# Patient Record
Sex: Male | Born: 1975 | Race: Black or African American | Hispanic: No | Marital: Single | Smoking: Current every day smoker
Health system: Southern US, Community
[De-identification: ages and names within clinical notes are randomized; demographics above are authoritative.]

## PROBLEM LIST (undated history)

## (undated) DIAGNOSIS — I10 Essential (primary) hypertension: Secondary | ICD-10-CM

## (undated) DIAGNOSIS — E119 Type 2 diabetes mellitus without complications: Secondary | ICD-10-CM

---

## 2000-01-17 ENCOUNTER — Emergency Department (HOSPITAL_COMMUNITY): Admission: EM | Admit: 2000-01-17 | Discharge: 2000-01-17 | Payer: Self-pay | Admitting: Emergency Medicine

## 2000-08-12 ENCOUNTER — Emergency Department (HOSPITAL_COMMUNITY): Admission: EM | Admit: 2000-08-12 | Discharge: 2000-08-12 | Payer: Self-pay | Admitting: Unknown Physician Specialty

## 2000-08-23 ENCOUNTER — Emergency Department (HOSPITAL_COMMUNITY): Admission: EM | Admit: 2000-08-23 | Discharge: 2000-08-23 | Payer: Self-pay | Admitting: Emergency Medicine

## 2002-11-15 ENCOUNTER — Emergency Department (HOSPITAL_COMMUNITY): Admission: EM | Admit: 2002-11-15 | Discharge: 2002-11-15 | Payer: Self-pay | Admitting: Unknown Physician Specialty

## 2004-04-09 ENCOUNTER — Emergency Department: Payer: Self-pay | Admitting: Internal Medicine

## 2007-07-03 ENCOUNTER — Emergency Department: Payer: Self-pay | Admitting: Internal Medicine

## 2011-05-24 ENCOUNTER — Emergency Department: Payer: Self-pay | Admitting: Emergency Medicine

## 2012-03-21 ENCOUNTER — Emergency Department: Payer: Self-pay | Admitting: Internal Medicine

## 2014-02-03 ENCOUNTER — Emergency Department: Payer: Self-pay | Admitting: Emergency Medicine

## 2014-02-03 LAB — BASIC METABOLIC PANEL
ANION GAP: 12 (ref 7–16)
BUN: 13 mg/dL (ref 7–18)
Calcium, Total: 8.1 mg/dL — ABNORMAL LOW (ref 8.5–10.1)
Chloride: 109 mmol/L — ABNORMAL HIGH (ref 98–107)
Co2: 20 mmol/L — ABNORMAL LOW (ref 21–32)
Creatinine: 1.49 mg/dL — ABNORMAL HIGH (ref 0.60–1.30)
EGFR (Non-African Amer.): 57 — ABNORMAL LOW
Glucose: 109 mg/dL — ABNORMAL HIGH (ref 65–99)
Osmolality: 282 (ref 275–301)
Potassium: 3.6 mmol/L (ref 3.5–5.1)
SODIUM: 141 mmol/L (ref 136–145)

## 2014-02-03 LAB — CBC
HCT: 43.4 % (ref 40.0–52.0)
HGB: 14.2 g/dL (ref 13.0–18.0)
MCH: 31.9 pg (ref 26.0–34.0)
MCHC: 32.8 g/dL (ref 32.0–36.0)
MCV: 97 fL (ref 80–100)
PLATELETS: 197 10*3/uL (ref 150–440)
RBC: 4.46 10*6/uL (ref 4.40–5.90)
RDW: 13.8 % (ref 11.5–14.5)
WBC: 7.7 10*3/uL (ref 3.8–10.6)

## 2014-02-27 ENCOUNTER — Inpatient Hospital Stay
Admission: RE | Admit: 2014-02-27 | Discharge: 2014-05-10 | Disposition: A | Discharge status: Home / Self Care | Payer: PRIVATE HEALTH INSURANCE | Source: Ambulatory Visit | Attending: Internal Medicine | Admitting: Internal Medicine

## 2014-02-27 DIAGNOSIS — T148XXA Other injury of unspecified body region, initial encounter: Secondary | ICD-10-CM

## 2014-02-27 DIAGNOSIS — R52 Pain, unspecified: Secondary | ICD-10-CM

## 2014-02-27 DIAGNOSIS — L24A9 Irritant contact dermatitis due friction or contact with other specified body fluids: Secondary | ICD-10-CM

## 2014-02-27 DIAGNOSIS — L0291 Cutaneous abscess, unspecified: Principal | ICD-10-CM

## 2014-02-28 ENCOUNTER — Other Ambulatory Visit: Payer: Self-pay | Admitting: *Deleted

## 2014-03-01 ENCOUNTER — Non-Acute Institutional Stay (INDEPENDENT_AMBULATORY_CARE_PROVIDER_SITE_OTHER): Payer: PRIVATE HEALTH INSURANCE | Admitting: Internal Medicine

## 2014-03-01 DIAGNOSIS — S83105D Unspecified dislocation of left knee, subsequent encounter: Secondary | ICD-10-CM | POA: Diagnosis not present

## 2014-03-01 DIAGNOSIS — G609 Hereditary and idiopathic neuropathy, unspecified: Secondary | ICD-10-CM | POA: Diagnosis not present

## 2014-03-01 DIAGNOSIS — D62 Acute posthemorrhagic anemia: Secondary | ICD-10-CM | POA: Diagnosis not present

## 2014-03-01 DIAGNOSIS — A4902 Methicillin resistant Staphylococcus aureus infection, unspecified site: Secondary | ICD-10-CM

## 2014-03-01 NOTE — Progress Notes (Signed)
Patient ID: [name], male   DOB: 1975-04-06, 38 y.o.   MRN: [id number]   This is an acute visit.  Level care skilled.  Facility [facility].  Chief complaint acute visit status post hospitalization for left knee dislocation with popliteal injury.  History of present illness.  Patient was hospitalized emergently for a left popliteal bypass medial and lateral fasciectomy's-this was after sustaining a left knee dislocation.  Postop he had persistent tachycardia-this resolved apparently with pain control.  Drug screen was positive for cocaine which may be contributing to the tachycardia which was asymptomatic.  Patient was intermittently febrile-he was discovered to have a right groin hematoma which was evacuated.  His white count trended out study showed gas in the soft tissue in the posterior knee and soft tissue edema.  He was taken back to the OR for washout and debridement with identification of devitalized muscle anterior compartment including tibias anterior and exterior digitorum longus which was debridement.   wound cultures grew out MRSA-he was started on vancomycin and Zosyn.  Initially wound VAC was placed-.  He was seen for pain management with recommendation to begin long-acting OxyContin Cymbalta and Celebrex.  He was recommended for extensive post op therapy which is why he is here.  He has completed his antibiotic therapy.  Currently he has no acute complaints other than his pain management has occasional breakthrough pain he is receiving OxyIR 10 mg every 4 hours he says it does help some but it does not last for 4 hours.  Previous medical history.  History of left knee dislocation with complications as noted above.  MRSA infection of soft tissue.  Acute blood loss anemia.  History of cocaine abuse.  Social history-patient is single he does have a girlfriend and several children -apparently they are quite supportive.  He is from [place]-again  he does have a history of illicit drug use.  He is currently a smoker 1 pack per day.  And does have alcohol use apparently 6 shots of liquor per week.  .  Family history-apparently he has no pertinent family history.  Medications.  Aspirin 81 mg daily.  Celebrex 200 mg twice a day.  Vitamin B12 500 g daily.  Colace 100 mg twice a day.  Cymbalta 60 mg daily.  Ferrous sulfate 325 mg daily.  Neurontin 600 mg twice a day.  OxyContin controlled release 30 mg twice a day.  Oxycodone 10 mg immediate release every 4 hours as needed.  Protonix 40 mg daily  Review of systems.  General no complaints of fever or chills.  Skin does not complain of rashes or itching has numerous surgical sites apparently these are stable has a wound VAC applied to right groin area.  Eyes does not complain of visual changes.  Ear nose mouth and throat-no complaint of sore throat or hearing loss.  Her sclerae does not complain of shortness breath or cough.  Cardiac-is not complaining of chest pain had a history of tachycardia in the hospital thought multi factor oh.  GU does not complain of dysuria.  GI-does not complaining of nausea vomiting diarrhea constipation or abdominal pain apparently some history of constipation previously he is on Colace.  Musculoskeletal-again does have some leg pain here he says the controlled OxyContin as well as immediate release to help but the immediate release does not last quite long enough.  Neurologic does have a history of some numbness left leg status post numerous procedures he is on Neurontin-does not complain of  headache or dizziness.  Psychological does not complain of depression and anxiety.  Physical exam.  Temperature is 98.2 pulse 92 respirations 20 blood pressure 153/85.  General this is a pleasant young male in no distress lying comfortably in bed.  His skin is warm and dry-surgical site Staples are in place the right inner thigh back in  place I do not see drainage bleeding erythema or sign of infection.  He also has been itching of his left toe apparently there's a skin tear this has been assessed by nursing.  His left leg is wrapped -- he does have edema he says this is baseline since surgery-he does have a positive pulsepedal  \ eyes-pupils appear reactive to light visual acuity appears intact.  Oropharynx clear mucous membranes moist.  Chest is clear to auscultation there is no labored breathing.  Abdomen soft nontender with positive bowel sounds.  Muscle school skeletal-as noted above the he does move his right lower leg and arms with baseline strength and range of motion I do not note any deformities left leg has changes as noted above.  Neurologic-is grossly intact has reduced touch sensation of his left leg he says this is not new-his speech is clear cranial nerves intact  Labs.  02/24/2014.  WBC 10.9 hemoglobin 7.3 platelets 592.  Sodium 140 potassium 4.1 BUN 16 creatinine 0.83.  Assessment and plan.  #1 left knee dislocation with complications as noted above including popliteal bypass lateral fasciectomy-complicated with soft tissue MRSA infection-at this point appears stable he is afebrile-he does have a wound VAC applied to the right groin area--  He has completed antibiotics and is here for rehabilitation-.  In regards to pain control he says it is helping but OxyIR does not last long enough [facility] needed and monitor-there's been no sign of sedation or respiratory depression--  He continues on OxyContin controlled release 30 mg twice a day-Cymbalta-as well as Celebrex-this was recommended per hospital consult.  We'll need extensive wound care being provided by nursing with follow-up as needed by surgery.  #2-history of postop anemia-will update a CBC he is on iron-he appears to be relatively stable in this regards.  #3-history of neuropathy he is on Neurontin  this persists in the left leg he says this is not new this will have to be monitored.  #4-HTN-at this point will monitor recent systolics have been in the 150s area diastolics in the 80s-90s-would like to get some more readings since we have relatively few if consistently elevated consider  Update BMP  [id number]-of note greater than 40 minutes spent assessing patient-reviewing his hospital records-and coordinating and formulating a plan of care for numerous diagnoses-of note greater than 50% of time spent coordinating plan of care  ..Marland Kitchen

## 2014-03-03 ENCOUNTER — Encounter: Payer: Self-pay | Admitting: Internal Medicine

## 2014-03-03 DIAGNOSIS — A4902 Methicillin resistant Staphylococcus aureus infection, unspecified site: Secondary | ICD-10-CM | POA: Insufficient documentation

## 2014-03-03 DIAGNOSIS — D62 Acute posthemorrhagic anemia: Secondary | ICD-10-CM | POA: Insufficient documentation

## 2014-03-03 DIAGNOSIS — G609 Hereditary and idiopathic neuropathy, unspecified: Secondary | ICD-10-CM | POA: Insufficient documentation

## 2014-03-03 DIAGNOSIS — S83105A Unspecified dislocation of left knee, initial encounter: Secondary | ICD-10-CM | POA: Insufficient documentation

## 2014-03-04 ENCOUNTER — Non-Acute Institutional Stay (SKILLED_NURSING_FACILITY): Payer: PRIVATE HEALTH INSURANCE | Admitting: Internal Medicine

## 2014-03-04 ENCOUNTER — Other Ambulatory Visit: Payer: Self-pay | Admitting: *Deleted

## 2014-03-04 DIAGNOSIS — A4902 Methicillin resistant Staphylococcus aureus infection, unspecified site: Secondary | ICD-10-CM

## 2014-03-04 DIAGNOSIS — S81809D Unspecified open wound, unspecified lower leg, subsequent encounter: Secondary | ICD-10-CM

## 2014-03-04 DIAGNOSIS — S85002D Unspecified injury of popliteal artery, left leg, subsequent encounter: Secondary | ICD-10-CM | POA: Diagnosis not present

## 2014-03-04 MED ORDER — OXYCODONE HCL 10 MG PO TABS: ORAL_TABLET | ORAL | Status: DC

## 2014-03-04 NOTE — Telephone Encounter (Signed)
[facility] 

## 2014-03-05 NOTE — Progress Notes (Addendum)
Patient ID: [name], male   DOB: October 22, 1975, 38 y.o.   MRN: [id number]               HISTORY & PHYSICAL  DATE:  03/04/2014     FACILITY: [facility]    LEVEL OF CARE:   SNF   CHIEF COMPLAINT:  Admission to SNF, post stay at [facility], 02/03/2014 through 02/27/2014.    HISTORY OF PRESENT ILLNESS:  This is a man who tells me he was out playing football in his neighborhood.  He ran for the ball land stepped in a pothole.  He was apparently admitted to another hospital and later [facility] after suffering a left knee dislocation, resulting in loss of pulses distal to the knee with popliteal injury.  It is noted that he was without distal lower extremity pulses for some period of time prior to the transfer.    He was taken urgently to the OR for a left popliteal bypass, medial and lateral fasciotomies.  He was identified as having a popliteal injury, sciatic nerve and tibial nerve stretch injury.  There is a harvest site in the right leg with vein where the femoral popliteal bypass.  He has an external fixator.    His CK peaked at 30,000.    Urine drug screen was positive for cocaine, contributing to persistent asymptomatic tachycardia.    There were voiding difficulties.  However, he managed to have a Foley discontinued on hospital day #3.  He was taken back to the OR on 02/12/2014 for washout and debridement of the lower extremity with devitalized muscle at the anterior compartment, including tibia os anterior and extensor digitorum longus which were debrided.  Intraoperative wound cultures grew MRSA.  He was started on vancomycin and Zosyn, which was tapered to gram-positive coverage once the cultures became known.    He was seen by Plastics for a wound at the posterior left lower extremity, who recommended Silvadene treatment over the skin.    He was taken back to the OR on 02/15/2014 for lower extremity washout and posterior department debridement with  placement of a wound VAC to the medial and lateral fasciotomy sites.    Chronic Pain saw the patient for management of uncontrolled pain, on multiple narcotics.    PAST MEDICAL HISTORY/PROBLEM LIST:   The only thing the patient told me about was hypertension.             CURRENT MEDICATIONS:  Medication list is reviewed.    ASA 81 q.d.    Celebrex 200 b.i.d.     Vitamin B12 500 daily.    Colace 100 b.i.d.    Cymbalta 60 q.d.    Ferrous sulfate 325 daily.    Neurontin 300, 2 tablets/600 mg three times a day.    OxyContin 30 mg q.12.    Oxycodone 10 mg q.4 p.r.n.    Protonix 40 q.d.    Silvadene to the posterior left leg.    SOCIAL HISTORY:    HOUSING:  The patient lives in [place] with his girlfriend.   FUNCTIONAL STATUS:  Independent man.  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   TOBACCO USE:  He smokes.   ILLICIT DRUGS:  Apparent history of cocaine use.  I did not get into this with him today.    REVIEW OF SYSTEMS:   CHEST/RESPIRATORY:  No shortness of breath.  CARDIAC:   No chest pain.    GI:  Bowels moving  well.  No issues.   GU:  No dysuria.    PHYSICAL EXAMINATION:   GENERAL APPEARANCE:  The patient is not in any distress.   CHEST/RESPIRATORY:  Clear air entry bilaterally.   CARDIOVASCULAR:  CARDIAC:   Heart sounds are normal.   GASTROINTESTINAL:  LIVER/SPLEEN/KIDNEYS:  No liver, no spleen.  No tenderness.   MUSCULOSKELETAL:   EXTREMITIES:   LEFT LOWER EXTREMITY:  He has two large fasciotomy sites, one on the medial distal left leg and one on the lateral.  Both of these have very clean, healthy granulation tissue.  They are applying wet-to-dry dressings.  He has an area on the posterior left leg that has a necrotic eschar.  They are applying Silvadene to this.  The usefulness of this here does not seem to be apparent to me.  However, for now I will leave these orders intact.  The leg is wrapped with a Kerlix/Ace wrap.   RIGHT LOWER EXTREMITY:   To the original vein harvest site in the right leg, they have a wound VAC.  Again, the tissue here looks really very healthy.     NEUROLOGICAL:    SENSATION/STRENGTH:  He does not have  sensation from an area just about at the ankle region down into the foot on the left.  He does have some plantar flexion, but no dorsiflexion.  This is compatible with peripheral nerve injury.    Notable that the entire leg is immobilized with an external fixator.    ASSESSMENT/PLAN:           Traumatic dislocation of the left knee with interruption of the popliteal vein.  Status post bypass graft, femoral to popliteal.  He had three surgeries for compartment syndrome as described above.  He has anesthesia and weakness of the distal left foot and ankle. 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   MRSA infection.  He has completed antibiotics.  He is no longer on any antibiotics.    Wound in the vein harvest site on the right medial upper thigh.  This looks healthy.  Wound VAC being applied here.    History of hypertension.   He is on atenolol.    History of cocaine abuse.  I did not discuss this with him today.    Tobacco abuse.  He will not be able to smoke while he is here.  He is aware of this.     CPT CODE: [id number]

## 2014-03-07 ENCOUNTER — Other Ambulatory Visit: Payer: Self-pay | Admitting: *Deleted

## 2014-03-07 MED ORDER — OXYCODONE HCL 10 MG PO TABS: ORAL_TABLET | ORAL | Status: DC

## 2014-03-07 NOTE — Telephone Encounter (Signed)
[facility] 

## 2014-03-12 ENCOUNTER — Other Ambulatory Visit: Payer: Self-pay | Admitting: *Deleted

## 2014-03-12 MED ORDER — OXYCODONE HCL 10 MG PO TABS: ORAL_TABLET | ORAL | Status: DC

## 2014-03-12 NOTE — Telephone Encounter (Signed)
[facility]

## 2014-03-20 ENCOUNTER — Non-Acute Institutional Stay (SKILLED_NURSING_FACILITY): Payer: PRIVATE HEALTH INSURANCE | Admitting: Internal Medicine

## 2014-03-20 DIAGNOSIS — R1013 Epigastric pain: Secondary | ICD-10-CM

## 2014-03-20 DIAGNOSIS — G8929 Other chronic pain: Secondary | ICD-10-CM | POA: Diagnosis not present

## 2014-03-20 DIAGNOSIS — I10 Essential (primary) hypertension: Secondary | ICD-10-CM

## 2014-03-25 NOTE — Progress Notes (Addendum)
Patient ID: [name], male   DOB: [birth year], 38 y.o.   MRN: [id number]               PROGRESS NOTE  DATE:  03/20/2014      FACILITY: [facility]    LEVEL OF CARE:   SNF   Acute Visit   HISTORY OF PRESENT ILLNESS:   This is a man who came to us after a stay at [facility] from 02/23/2014 through 02/27/2014.  He originally injured his left leg, suffering a left knee dislocation while playing outside and stepping in a pothole.  He ended up with popliteal artery injury.  He required urgent trip to the OR for popliteal artery bypass, medial and lateral fasciectomy secondary to compartment syndrome.  He also has sciatic nerve and tibial stretch injury.    Premorbidly, the only thing the patient had was borderline hypertension.  I have been shown his blood pressures today which are generally in the 150/80, 130/90 range.  She is on atenolol, which was started in the hospital.    He had a recent trip back to see his surgeons, who removed his external fixator.  His leg is wrapped and he is in a brace.    REVIEW OF SYSTEMS:   NEUROLOGICAL:  The patient is unable to move or feel his foot.    CARDIAC:   He has no cardiac history and was told by his employment nurse that he has borderline hypertension.    GI:  He is complaining of upper abdominal pain just below his rib cage.  This is episodic.  He states he is having bowel movements.  No nausea or vomiting.   CHEST/RESPIRATORY:   He is not short of breath.  This is not pleuritic.    PHYSICAL EXAMINATION:   GENERAL APPEARANCE:  The patient is not in any distress.   CHEST/RESPIRATORY:  Clear air entry bilaterally.   CARDIOVASCULAR:  CARDIAC:   Heart sounds are normal.  There are no murmurs.    GASTROINTESTINAL:  ABDOMEN:   His abdomen is distended.  Bowel sounds, however, are positive in rushes.   MUSCULOSKELETAL:   CHEST WALL:  I cannot reproduce his pain by palpating his sternum or his ribs.       EXTREMITIES:   LEFT  LOWER EXTREMITY:  I did not remove his brace or his wrap.  He still does not have any function or sensation in the left foot.    ASSESSMENT/PLAN:                     Borderline hypertension.  I have in my original notes that he was on atenolol, although I do not see this currently.    Upper abdominal/chest wall pain.  I wonder whether this is simply distention from colonic or intestinal gas.  I am going to put him on routine MiraLAX to see if we can mobilize this.  At this point, this does not sound ominous.  It is certainly not exertional.

## 2014-03-27 ENCOUNTER — Ambulatory Visit (HOSPITAL_COMMUNITY): Payer: PRIVATE HEALTH INSURANCE

## 2014-03-27 ENCOUNTER — Non-Acute Institutional Stay (SKILLED_NURSING_FACILITY): Payer: PRIVATE HEALTH INSURANCE | Admitting: Internal Medicine

## 2014-03-27 ENCOUNTER — Ambulatory Visit (HOSPITAL_COMMUNITY)
Admit: 2014-03-27 | Discharge: 2014-03-27 | Disposition: A | Discharge status: Home / Self Care | Payer: PRIVATE HEALTH INSURANCE | Source: Ambulatory Visit | Attending: Internal Medicine | Admitting: Internal Medicine

## 2014-03-27 DIAGNOSIS — T814XXA Infection following a procedure, initial encounter: Secondary | ICD-10-CM | POA: Insufficient documentation

## 2014-03-27 DIAGNOSIS — B999 Unspecified infectious disease: Secondary | ICD-10-CM | POA: Insufficient documentation

## 2014-03-27 DIAGNOSIS — A4902 Methicillin resistant Staphylococcus aureus infection, unspecified site: Secondary | ICD-10-CM

## 2014-03-27 DIAGNOSIS — L02419 Cutaneous abscess of limb, unspecified: Secondary | ICD-10-CM | POA: Diagnosis not present

## 2014-03-27 DIAGNOSIS — L089 Local infection of the skin and subcutaneous tissue, unspecified: Secondary | ICD-10-CM | POA: Diagnosis not present

## 2014-03-27 DIAGNOSIS — L03119 Cellulitis of unspecified part of limb: Secondary | ICD-10-CM | POA: Diagnosis not present

## 2014-03-27 DIAGNOSIS — Z452 Encounter for adjustment and management of vascular access device: Secondary | ICD-10-CM | POA: Insufficient documentation

## 2014-03-27 NOTE — Progress Notes (Signed)
Peripherally Inserted Central Catheter/Midline Placement  The IV Nurse has discussed with the patient and/or persons authorized to consent for the patient, the purpose of this procedure and the potential benefits and risks involved with this procedure.  The benefits include less needle sticks, lab draws from the catheter and patient may be discharged home with the catheter.  Risks include, but not limited to, infection, bleeding, blood clot (thrombus formation), and puncture of an artery; nerve damage and irregular heat beat.  Alternatives to this procedure were also discussed.  PICC/Midline Placement Documentation  PICC / Midline Single Lumen 03/27/14 PICC Left Basilic 49 cm 1 cm (Active)  Indication for Insertion or Continuance of Line Prolonged intravenous therapies 03/27/2014  2:44 PM  Exposed Catheter (cm) 1 cm 03/27/2014  2:44 PM  Site Assessment Clean;Dry;Intact 03/27/2014  2:44 PM  Line Status Flushed;Saline locked;Blood return noted 03/27/2014  2:44 PM  Dressing Type Transparent;Securing device 03/27/2014  2:44 PM  Dressing Status Clean;Dry;Intact;Antimicrobial disc in place 03/27/2014  2:44 PM  Line Care Connections checked and tightened 03/27/2014  2:44 PM  Dressing Intervention New dressing 03/27/2014  2:44 PM  Dressing Change Due 04/03/14 03/27/2014  2:44 PM       Inserted by Trenton GammonGina Witherspoon, PICC RN  03/27/2014, 2:48 PM

## 2014-03-27 NOTE — Progress Notes (Signed)
Patient ID: [name], male   DOB: 12/23/1975, 38 y.o.   MRN: [id number] Facility; [facility] Chief complaint; draining sinuses on the left leg History; this is a patient it [facility]. He is a gentleman who had a left knee dislocation while playing football near his home and an injury of the left popliteal artery which was repaired. He had sciatic nerve and tibial nerve stretch injury. His hospital stay was complex and he required bilateral fasciotomies of the lower left leg secondary I believe to compartment syndrome. His postoperative course from this was complicated by a CT scan that showed gas in the soft tissues in the posterior knee and soft tissue edema. He was taken back to the OR for washout and debridement of left lower extremity. Intraoperative cultures grew MRSA. He was treated with thank and Zosyn ultimately changed to Zosyn which he completed a 2 week course which ended on 02/27/2014. He has not been on antibiotics while he is in this facility.   2 days ago the staff discovered a draining area on the anterior part of his left leg between the fasciotomy incisions over the tibia. This was deeply cultured by the wound care nurse. A subsequent draining area was noted on the inferior aspect of the medial wound on the left leg medial incision. This also probes to bone. The patient notes increasing pain. Apparently the drainage was purulent and malodorous. He has not been running a fever. I actually saw him 2 days ago for an unrelated issue in the morning and this was not present, both the wound care nurse and the patient reiterate this  Physical examination Gen. the patient is not in any distress Vitals blood pressure 153/82 respirations 20 pulse 83 temperature 98.2 Left anterior leg; his fasciotomy incisions have clean granulation and these are gradually getting better. He has some areas on the posterior aspect of the leg that are covered with a blackened eschar which  apparently is going to be debridement by plastic surgery. These do not look to be the source of any infection. On the anterior aspect of the left leg right over the tibia and in the inferior aspect of the medial left fasciotomy wound are 2 draining sinuses. These probe close to her on the tibia itself. As mentioned a culture has been done of this area. There appears to be some swelling and some tenderness although the patient has nerve damage here and sensation is limited.  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 A culture that we did the about 18 hours ago so far is negative although it shows a lot of white cells. Patient is going to need imaging studies here probably a CT scan with contrast. I suspect he is going to need intravenous antibiotics and I'm going to restart the vancomycin and Zosyn.   I will attempt to arrange a return to [facility]. A repeat culture of the lateral draining area will be done

## 2014-03-27 NOTE — Discharge Instructions (Signed)
Peripherally Inserted Central Catheter/Midline Placement  The IV Nurse has discussed with the patient and/or persons authorized to consent for the patient, the purpose of this procedure and the potential benefits and risks involved with this procedure.  The benefits include less needle sticks, lab draws from the catheter and patient may be discharged home with the catheter.  Risks include, but not limited to, infection, bleeding, blood clot (thrombus formation), and puncture of an artery; nerve damage and irregular heat beat.  Alternatives to this procedure were also discussed.  PICC/Midline Placement Documentation  PICC / Midline Single Lumen 03/27/14 PICC Left Basilic 49 cm 1 cm (Active)  Indication for Insertion or Continuance of Line Prolonged intravenous therapies 03/27/2014  2:44 PM  Exposed Catheter (cm) 1 cm 03/27/2014  2:44 PM  Site Assessment Clean;Dry;Intact 03/27/2014  2:44 PM  Line Status Flushed;Saline locked;Blood return noted 03/27/2014  2:44 PM  Dressing Type Transparent;Securing device 03/27/2014  2:44 PM  Dressing Status Clean;Dry;Intact;Antimicrobial disc in place 03/27/2014  2:44 PM  Line Care Connections checked and tightened 03/27/2014  2:44 PM  Dressing Intervention New dressing 03/27/2014  2:44 PM  Dressing Change Due 04/03/14 03/27/2014  2:44 PM    PICC Insertion, Care After Refer to this sheet in the next few weeks. These instructions provide you with information on caring for yourself after your procedure. Your health care provider may also give you more specific instructions. Your treatment has been planned according to current medical practices, but problems sometimes occur. Call your health care provider if you have any problems or questions after your procedure. WHAT TO EXPECT AFTER THE PROCEDURE After your procedure, it is typical to have the following:  Mild discomfort at the insertion site. This should not last more than a day. HOME CARE INSTRUCTIONS  Rest  at home for the remainder of the day after the procedure.  You may bend your arm and move it freely. If your PICC is near or at the bend of your elbow, avoid activity with repeated motion at the elbow.  Avoid lifting heavy objects as instructed by your health care provider.  Avoid using a crutch with the arm on the same side as your PICC. You may need to use a walker. Bandage Care  Keep your PICC bandage (dressing) clean and dry to prevent infection.  Ask your health care provider when you may shower. To keep the dressing dry, cover the PICC with plastic wrap and tape before showering. If the dressing does become wet, replace it right after the shower.  Do not soak in the bath, swim, or use hot tubs when you have a PICC.  Change the PICC dressing as instructed by your health care provider.  Change your PICC dressing if it becomes loose or wet. General PICC Care  Check the PICC insertion site daily for leakage, redness, swelling, or pain.  Flush the PICC as directed by your health care provider. Let your health care provider know right away if the PICC is difficult to flush or does not flush. Do not use force to flush the PICC.  Do not use a syringe that is less than 10 mL to flush the PICC.  Never pull or tug on the PICC.  Avoid blood pressure checks on the arm with the PICC.  Keep your PICC identification card with you at all times.  Do not take the PICC out yourself. Only a trained health care professional should remove the PICC. SEEK MEDICAL CARE IF:  You have pain  in your arm, ear, face, or teeth.  You have fever or chills.  You have drainage from the PICC insertion site.  You have redness or palpate a "cord" around the PICC insertion site.  You cannot flush the catheter. SEEK IMMEDIATE MEDICAL CARE IF:  You have swelling in the arm in which the PICC is inserted. Document Released: 01/03/2013 Document Revised: 03/20/2013 Document Reviewed: 01/03/2013 [facility]  Patient Information 2015 [employer] [place]. This information is not intended to replace advice given to you by your health care provider. Make sure you discuss any questions you have with your health care provider.   If you have any questions concerning your PICC line please call [facility] [phone] or [phone].

## 2014-03-28 ENCOUNTER — Ambulatory Visit (HOSPITAL_COMMUNITY)
Admit: 2014-03-28 | Discharge: 2014-03-28 | Disposition: A | Discharge status: Home / Self Care | Payer: PRIVATE HEALTH INSURANCE | Source: Ambulatory Visit | Attending: Internal Medicine | Admitting: Internal Medicine

## 2014-03-28 ENCOUNTER — Other Ambulatory Visit (HOSPITAL_COMMUNITY): Payer: PRIVATE HEALTH INSURANCE

## 2014-03-28 ENCOUNTER — Ambulatory Visit (HOSPITAL_COMMUNITY)
Admit: 2014-03-28 | Discharge: 2014-03-28 | Disposition: A | Discharge status: Home / Self Care | Payer: PRIVATE HEALTH INSURANCE | Attending: Internal Medicine | Admitting: Internal Medicine

## 2014-03-28 DIAGNOSIS — L02416 Cutaneous abscess of left lower limb: Secondary | ICD-10-CM | POA: Diagnosis not present

## 2014-03-28 DIAGNOSIS — M7989 Other specified soft tissue disorders: Secondary | ICD-10-CM | POA: Insufficient documentation

## 2014-03-28 DIAGNOSIS — M25562 Pain in left knee: Secondary | ICD-10-CM | POA: Insufficient documentation

## 2014-03-28 MED ORDER — IOHEXOL 300 MG/ML  SOLN: 100.0000 mL | Freq: Once | INTRAMUSCULAR | Status: AC | PRN

## 2014-03-28 MED ORDER — GADOBENATE DIMEGLUMINE 529 MG/ML IV SOLN
20.0000 mL | Freq: Once | INTRAVENOUS | Status: AC | PRN
  Administered 2014-03-28: 20 mL via INTRAVENOUS

## 2014-04-05 ENCOUNTER — Other Ambulatory Visit: Payer: Self-pay | Admitting: *Deleted

## 2014-04-05 MED ORDER — OXYCODONE HCL 10 MG PO TABS: ORAL_TABLET | ORAL | Status: DC

## 2014-04-05 MED ORDER — OXYCODONE HCL ER 30 MG PO T12A: EXTENDED_RELEASE_TABLET | ORAL | Status: DC

## 2014-04-05 NOTE — Telephone Encounter (Signed)
[facility] 

## 2014-04-08 ENCOUNTER — Non-Acute Institutional Stay (SKILLED_NURSING_FACILITY): Payer: PRIVATE HEALTH INSURANCE | Admitting: Internal Medicine

## 2014-04-08 DIAGNOSIS — L02419 Cutaneous abscess of limb, unspecified: Secondary | ICD-10-CM | POA: Diagnosis not present

## 2014-04-08 DIAGNOSIS — S85002D Unspecified injury of popliteal artery, left leg, subsequent encounter: Secondary | ICD-10-CM | POA: Diagnosis not present

## 2014-04-08 DIAGNOSIS — A4902 Methicillin resistant Staphylococcus aureus infection, unspecified site: Secondary | ICD-10-CM | POA: Diagnosis not present

## 2014-04-08 DIAGNOSIS — L03119 Cellulitis of unspecified part of limb: Secondary | ICD-10-CM

## 2014-04-09 NOTE — Progress Notes (Addendum)
Patient ID: [name], male   DOB: 07-20-1975, 39 y.o.   MRN: [id number]               HISTORY & PHYSICAL  DATE:  04/08/2014                 FACILITY: [facility]             LEVEL OF CARE:   SNF   CHIEF COMPLAINT:  Readmission to the facility, post stay at [facility], 03/28/2014 through 04/04/2014.    HISTORY OF PRESENT ILLNESS:  This is a patient who initially came to us from [facility].  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  He required an external fixator, a fem-pop bypass with vein at the time.    Postoperative course was complicated by compartment syndrome, wound infection, requiring fasciotomies and multiple returns to the OR for wash-outs.  His postoperative wound infection was polymicrobial with anaerobes, diphtheroids, and methicillin-sensitive Staph aureus.  The patient was discharged on vancomycin for two weeks.    Before he went out, it was noted here that he had a draining sinus on the anterior right leg and also on the lateral aspect of one of his fasciotomy incisions.  I believe these ultimately cultured MRSA.    [facility] to [facility].  He had an incision and drainage of an abscess in the OR on 03/30/2014.  Restarted on IV antibiotics.  Intraoperative cultures grew Staph aureus.  However, due to his previous polymicrobial infections, he is now discharged on six weeks of Invanz and vancomycin.  He is recommended for 30% weightbearing status with an appropriate brace and boot.    CURRENT MEDICATIONS:  Discharge medications include:    ASA 81 q.d.    Vitamin B12, 500 daily.    Colace 100 b.i.d.    Cymbalta 60 mg daily.    Ertapenem 1 g IV q.d.  Stop date is 05/10/2014.    Ferrous sulfate 325, 1 tablet daily with breakfast.    Neurontin 300 mg, 2 capsules three times a day.    Hydrochlorothiazide 50 mg a day for hypertension (I believe this was started in the hospital).    Oxycodone 30 mg  q.12.     Oxycodone IR 10 mg q.4 p.r.n.    Vancomycin 1.5 g every 8 hours.  Stop date is 05/10/2014.             PHYSICAL EXAMINATION:    GENERAL APPEARANCE:  The patient looks well.   CHEST/RESPIRATORY:  Exam is clear.   CARDIOVASCULAR:  CARDIAC:   Heart sounds are normal.  There are no murmurs.   SKIN:  INSPECTION:  Left leg:  Her lateral left leg incision actually appears to be well granulated.  It appears that they did his surgery through the inferior aspect of this wound and this drains down roughly 3 cm.   The area on the right lateral leg has healthy-looking granulation and should heal.  He has a surface eschar over a posterior leg wound that was there previously.  This is beginning to separate.  I am surprised that they did not do anything about this in the hospital.  However, I will debride this further on Wednesday.   NEUROLOGICAL:   He has had no return in the left foot, no sensory or motor function in the ankle or below.     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  He has undergone wound debridement and incision and drainage.  He is now on broad spectrum antibiotics, including vancomycin and ertapenem.  He is to have a follow-up imaging study on 04/16/2014, an MRI.  He has follow-up with Orthopedics on 04/25/2014 and with Infectious Disease on 05/07/2014.  He is approved to begin weightbearing.

## 2014-04-10 ENCOUNTER — Non-Acute Institutional Stay (SKILLED_NURSING_FACILITY): Payer: PRIVATE HEALTH INSURANCE | Admitting: Internal Medicine

## 2014-04-10 DIAGNOSIS — R21 Rash and other nonspecific skin eruption: Secondary | ICD-10-CM

## 2014-04-10 DIAGNOSIS — A4902 Methicillin resistant Staphylococcus aureus infection, unspecified site: Secondary | ICD-10-CM

## 2014-04-10 DIAGNOSIS — S83105D Unspecified dislocation of left knee, subsequent encounter: Secondary | ICD-10-CM | POA: Diagnosis not present

## 2014-04-14 NOTE — Progress Notes (Signed)
Patient ID: [name], male   DOB: 01-01-1976, 39 y.o.   MRN: [id number]  :                    FACILITY: [facility]             LEVEL OF CARE:   SNF   CHIEF COMPLAINT: Acute visit secondary to lump under her left knee    HISTORY OF PRESENT ILLNESS:  This is a patient who initially came to [place] from [facility].  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  He required an external fixator, a fem-pop bypass with vein at the time.    Postoperative course was complicated by compartment syndrome, wound infection, requiring fasciotomies and multiple returns to the OR for wash-outs.  His postoperative wound infection was polymicrobial with anaerobes, diphtheroids, and methicillin-sensitive Staph aureus.  The patient was discharged on vancomycin for two weeks.    Before he went out, it was noted here that he had a draining sinus on the anterior right leg and also on the lateral aspect of one of his fasciotomy incisions.  I believe these ultimately cultured MRSA.    He returned to [facility].  He had an incision and drainage of an abscess in the OR on 03/30/2014.  Restarted on IV antibiotics.  Intraoperative cultures grew Staph aureus.  However, due to his previous polymicrobial infections, hewas discharged on six weeks of Invanz and vancomycin.  He is recommended for 30% weightbearing status with an appropriate brace and boot.   Patient has developed a lump on the posterior inferior portion of the left knee area this is slightly tender to palpation not overtly erythematous or significantly warm-he says this was present in the hospital to some extent possibly has gotten a bit larger-of note he did have a boot applied earlier today in this area appears to be somewhat superior to where the strap was which would make one think possibly this is more of a fluid situation.       CURRENT MEDICATIONS:  Discharge medications include:    ASA 81 q.d.     Vitamin B12, 500 daily.    Colace 100 b.i.d.    Cymbalta 60 mg daily.    Ertapenem 1 g IV q.d.  Stop date is 05/10/2014.    Ferrous sulfate 325, 1 tablet daily with breakfast.    Neurontin 300 mg, 2 capsules three times a day.    Hydrochlorothiazide 50 mg a day for hypertension (I believe this was started in the hospital).    Oxycodone 30 mg q.12.     Oxycodone IR 10 mg q.4 p.r.n.    Vancomycin 1.5 g every 8 hours.  Stop date is 05/10/2014.    Review of systems.    Does not complain of fever or chills.    Respiratory no shortness of breath or cough.    Cardiac no chest pain.    Musculoskeletal does not complain of any unrelieved leg or joint pain currently             PHYSICAL EXAMINATION:     He is afebrile GENERAL APPEARANCE:  The patient looks well.   CHEST/RESPIRATORY:  Exam is clear.   CARDIOVASCULAR:  CARDIAC:   Heart sounds are normal.  There are no murmurs.   SKIN:  INSPECTION:  Left leg:  Her lateral left leg incision  appears to be well granulated.  .   The area  on the right lateral leg has healthy-looking granulation .--.  I do note inferior-posterior to the left knee there is a flesh-colored lump-this is not overtly warm to touch or overtly erythematous it is mildly tender to palpation- .   NEUROLOGICAL:   He has had no return in the left foot, no sensory or motor function in the ankle or below.    Labs.  04/05/2014.  WBC 9.1 hemoglobin 10.3 platelets 343.  Liver function tests within normal limits.  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  He has undergone wound debridement and incision and drainage.  He is now on broad spectrum antibiotics, including vancomycin and ertapenem.  He is to have a follow-up imaging study on 04/16/2014, an MRI.  He has follow-up  with Orthopedics on 04/25/2014 and with Infectious Disease on 05/07/2014.  He is approved to begin weightbearing.    In regards to lump under left knee-this will warrant close monitoring for now-again this could be fluid related but will have to be watched-speaking with the wound care nurse who noticed it she says the lump actually has gone down since the boot strap was removed which would lead one to believe this is possibly more of a fluid shift situation-however this will have to be watched closely    [id number]

## 2014-04-24 ENCOUNTER — Non-Acute Institutional Stay (SKILLED_NURSING_FACILITY): Payer: PRIVATE HEALTH INSURANCE | Admitting: Internal Medicine

## 2014-04-24 DIAGNOSIS — L02419 Cutaneous abscess of limb, unspecified: Secondary | ICD-10-CM | POA: Diagnosis not present

## 2014-04-24 DIAGNOSIS — L03119 Cellulitis of unspecified part of limb: Secondary | ICD-10-CM

## 2014-04-28 NOTE — Progress Notes (Addendum)
Patient ID: [name], male   DOB: 1975/09/11, 39 y.o.   MRN: [id number]                PROGRESS NOTE  DATE:  04/24/2014              FACILITY: [facility]                       LEVEL OF CARE:   SNF   Acute Visit                                  CHIEF COMPLAINT:  Follow up left leg.    HISTORY OF PRESENT ILLNESS:  The nurses in the facility have expressed some concern about swelling on the lateral aspect of the left leg and continued drainage.  In reviewing things on the chart and also discussing with the patient, he apparently had an MRI the week before last although we do not have these results.  He continues on vancomycin and Invanz for methicillin-sensitive Staph aureus as well as polymicrobial infection and possible anaerobic infection.  He also has an area covered with a thick, black eschar on the back of his leg.    [facility].  He finishes with these in February.  He has a January 28th follow-up with Ortho and a February 9th appointment with ID.    For some reason, he is felt to need a skin graft on the back of his left leg, although I am not really sure about whether this is going to be necessary.     PHYSICAL EXAMINATION:   NEUROLOGIC:   SENSATION/STRENGTH:  Left leg:  There has been perhaps a small amount of improvement in what he can do with the left foot, although that may be grasping at straws.  He is insensate on the anterior part of his leg to just below the tibial tuberosity.  He feels normally in the back of his leg.   SKIN:   INSPECTION:  Left leg:   The wounds are receding on the lateral aspect.  The draining area/surgical area on the inferior recess of the wound is still open, although there does not appear to be any purulent drainage.  Although there is still some swelling, I think this is gradually receding.    On the back of the left leg is his original wound that was covered with a black eschar.  I removed this.  Further  debridement is going to need to be done.  Whether this needs a skin graft or not ultimately, I am uncertain.             ASSESSMENT/PLAN:                              Infection of the left leg.  I think this actually appears stable.   He had an MRI two weeks ago.  We did not get the result but I think if there was not improvement, we would have heard.  He continues on his antibiotics until, I believe, the middle part of February.

## 2014-05-01 ENCOUNTER — Non-Acute Institutional Stay (SKILLED_NURSING_FACILITY): Payer: PRIVATE HEALTH INSURANCE | Admitting: Internal Medicine

## 2014-05-01 DIAGNOSIS — S81809D Unspecified open wound, unspecified lower leg, subsequent encounter: Secondary | ICD-10-CM | POA: Diagnosis not present

## 2014-05-01 DIAGNOSIS — L03119 Cellulitis of unspecified part of limb: Secondary | ICD-10-CM | POA: Diagnosis not present

## 2014-05-01 DIAGNOSIS — L02419 Cutaneous abscess of limb, unspecified: Secondary | ICD-10-CM

## 2014-05-01 DIAGNOSIS — A4902 Methicillin resistant Staphylococcus aureus infection, unspecified site: Secondary | ICD-10-CM

## 2014-05-02 ENCOUNTER — Encounter (HOSPITAL_COMMUNITY)
Admission: RE | Admit: 2014-05-02 | Discharge: 2014-05-02 | Disposition: A | Discharge status: Home / Self Care | Payer: PRIVATE HEALTH INSURANCE | Source: Skilled Nursing Facility | Attending: Internal Medicine | Admitting: Internal Medicine

## 2014-05-02 LAB — VANCOMYCIN, TROUGH: Vancomycin Tr: 16.2 ug/mL (ref 10.0–20.0)

## 2014-05-03 ENCOUNTER — Other Ambulatory Visit (HOSPITAL_COMMUNITY)
Admission: RE | Admit: 2014-05-03 | Discharge: 2014-05-03 | Disposition: A | Discharge status: Home / Self Care | Payer: PRIVATE HEALTH INSURANCE | Source: Ambulatory Visit | Attending: Internal Medicine | Admitting: Internal Medicine

## 2014-05-03 LAB — CBC WITH DIFFERENTIAL/PLATELET
BASOS PCT: 1 % (ref 0–1)
Basophils Absolute: 0.1 10*3/uL (ref 0.0–0.1)
EOS ABS: 0.3 10*3/uL (ref 0.0–0.7)
EOS PCT: 4 % (ref 0–5)
HEMATOCRIT: 38.6 % — AB (ref 39.0–52.0)
HEMOGLOBIN: 11.9 g/dL — AB (ref 13.0–17.0)
Lymphocytes Relative: 31 % (ref 12–46)
Lymphs Abs: 2.5 10*3/uL (ref 0.7–4.0)
MCH: 27 pg (ref 26.0–34.0)
MCHC: 30.8 g/dL (ref 30.0–36.0)
MCV: 87.7 fL (ref 78.0–100.0)
MONOS PCT: 12 % (ref 3–12)
Monocytes Absolute: 0.9 10*3/uL (ref 0.1–1.0)
Neutro Abs: 4.2 10*3/uL (ref 1.7–7.7)
Neutrophils Relative %: 52 % (ref 43–77)
PLATELETS: 292 10*3/uL (ref 150–400)
RBC: 4.4 MIL/uL (ref 4.22–5.81)
RDW: 15.7 % — AB (ref 11.5–15.5)
WBC: 8 10*3/uL (ref 4.0–10.5)

## 2014-05-03 LAB — BUN: BUN: 15 mg/dL (ref 6–23)

## 2014-05-03 LAB — HEPATIC FUNCTION PANEL
ALT: 80 U/L — AB (ref 0–53)
AST: 34 U/L (ref 0–37)
Albumin: 3.1 g/dL — ABNORMAL LOW (ref 3.5–5.2)
Alkaline Phosphatase: 131 U/L — ABNORMAL HIGH (ref 39–117)
BILIRUBIN DIRECT: 0.1 mg/dL (ref 0.0–0.5)
BILIRUBIN INDIRECT: 0.4 mg/dL (ref 0.3–0.9)
TOTAL PROTEIN: 6.7 g/dL (ref 6.0–8.3)
Total Bilirubin: 0.5 mg/dL (ref 0.3–1.2)

## 2014-05-03 LAB — CREATININE, SERUM
CREATININE: 0.87 mg/dL (ref 0.50–1.35)
GFR calc Af Amer: >90 mL/min (ref >90)

## 2014-05-04 NOTE — Progress Notes (Addendum)
Patient ID: [name], male   DOB: Aug 26, 1975, 39 y.o.   MRN: [id number]               PROGRESS NOTE  DATE:  05/01/2014                 FACILITY: [facility]                         LEVEL OF CARE:   SNF   Acute Visit   CHIEF COMPLAINT:  Follow up left leg.    HISTORY OF PRESENT ILLNESS:  I am following up this man's surgical wounds on the left leg, which generally have been progressing quite well.  He also [facility].    He continues on his antibiotics, vanc and Invanz, for the methicillin-sensitive Staph aureus as well as polymicrobial, possibly anaerobic infections.    He has an area on the back of his left leg that was initially covered with a black eschar and I have been following this.    PHYSICAL EXAMINATION:                   SKIN:  INSPECTION:   Left leg:  The surgical wounds for his compartment syndromes, medially and laterally, are doing well.  The medial one is actually more extensive now.  Had a draining area at the inferior part of this which prompted his last admission to [facility], although even that area appears to be doing better.  The area on the back of his leg is covered with a tan-colored eschar.  We used lidocaine on this and a curette.  I did a selective debridement with a copious amount of surface eschar.  Underneath the tissue appears to be healthy.    ASSESSMENT/PLAN:     All of these wounds appear to be improving.  Debridement done on the posterior one as noted.  We will continue with Santyl based dressing here.    Surgical wounds with underlying deep infection.  All of this appears to be improving.  I think he follows up with Infectious Disease on the 9th.  He should have recently seen Orthopedics.    Neurologic damage in the left leg.  He has only made minimal improvement here.  He is beginning to have a bit of a flexion contracture.  I think Physical Therapy is on top of this.  They are doing range and have ordered a special  brace for that left leg.

## 2014-05-06 ENCOUNTER — Other Ambulatory Visit (HOSPITAL_COMMUNITY)
Admission: RE | Admit: 2014-05-06 | Discharge: 2014-05-06 | Disposition: A | Discharge status: Home / Self Care | Payer: PRIVATE HEALTH INSURANCE | Source: Ambulatory Visit | Attending: Internal Medicine | Admitting: Internal Medicine

## 2014-05-06 ENCOUNTER — Other Ambulatory Visit (HOSPITAL_COMMUNITY)
Admission: RE | Admit: 2014-05-06 | Discharge: 2014-05-06 | Disposition: A | Discharge status: Home / Self Care | Payer: PRIVATE HEALTH INSURANCE | Source: Skilled Nursing Facility | Attending: Internal Medicine | Admitting: Internal Medicine

## 2014-05-06 LAB — CREATININE, SERUM
Creatinine, Ser: 1.15 mg/dL (ref 0.50–1.35)
GFR calc non Af Amer: 80 mL/min — ABNORMAL LOW (ref >90)

## 2014-05-06 LAB — VANCOMYCIN, TROUGH: Vancomycin Tr: 15.2 ug/mL (ref 10.0–20.0)

## 2014-05-06 LAB — BUN: BUN: 19 mg/dL (ref 6–23)

## 2014-05-09 ENCOUNTER — Non-Acute Institutional Stay (SKILLED_NURSING_FACILITY): Payer: PRIVATE HEALTH INSURANCE | Admitting: Internal Medicine

## 2014-05-09 DIAGNOSIS — L02419 Cutaneous abscess of limb, unspecified: Secondary | ICD-10-CM | POA: Diagnosis not present

## 2014-05-09 DIAGNOSIS — L03119 Cellulitis of unspecified part of limb: Secondary | ICD-10-CM

## 2014-05-09 DIAGNOSIS — S81809D Unspecified open wound, unspecified lower leg, subsequent encounter: Secondary | ICD-10-CM

## 2014-05-09 DIAGNOSIS — S85002D Unspecified injury of popliteal artery, left leg, subsequent encounter: Secondary | ICD-10-CM

## 2014-05-11 NOTE — Progress Notes (Addendum)
Patient ID: Sharlene Doryravis A Balderrama, male   DOB: 05-Jul-1975, 39 y.o.   MRN: [id number]                PROGRESS NOTE  DATE:  05/08/2013                FACILITY: [facility]                 LEVEL OF CARE:   SNF   Acute Visit                                  CHIEF COMPLAINT:  Follow up left leg.    HISTORY OF PRESENT ILLNESS:  Mr. Alferd PateeRichmond has [facility].  He was diagnosed with extreme pyomyositis in his left leg, mixed infection with MRSA, gram-negatives, gram-positives, and anaerobes.  He also has osteomyelitis, although I did not previously know this.  He completed six weeks of vancomycin and ertapenem here.    Apparently, his most recent MRI demonstrated ongoing lower extremity abscesses.  There are instructions here to change him to daptomycin 6 mg/kg IV once daily and to continue the ertapenem at 1 g IV daily.  Apparently, this has been referred to [facility] and the patient, therefore, is prepping to go home at the end of this week.  There is also a repeat MRI ordered on the 22nd of February.    I had some thoughts about how easy it would be to get IV daptomycin at home, although apparently this can be arranged.  If so, he should be able to go.    PHYSICAL EXAMINATION:   SKIN:   INSPECTION:  In terms of the exam of his leg, his wounds on the medial aspect are closing over, although he still has a 2 cm draining area on the lower aspect of that incision.  The area on the right side is larger, but with healthy granulation and continued improvement.  On the back side of his leg, I continued the debridement process of the small wound posteriorly with a curette.  I think this can change to a collagen-based dressing.  Things seem to be going well.    ASSESSMENT/PLAN:                                   Pyomyositis of the left leg, as described.  He is now on daptomycin and ertapenem.  Hopefully, it is as easy to arrange as what the  patient was told.  He will go home with predominantly wet-to-dry dressings; however, collagen to the back of his leg.    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

## 2014-05-13 ENCOUNTER — Emergency Department: Payer: Self-pay | Admitting: Emergency Medicine

## 2014-09-21 ENCOUNTER — Emergency Department
Admission: EM | Admit: 2014-09-21 | Discharge: 2014-09-21 | Disposition: A | Discharge status: Home / Self Care | Payer: Medicaid Other | Attending: Emergency Medicine | Admitting: Emergency Medicine

## 2014-09-21 ENCOUNTER — Emergency Department: Payer: Medicaid Other

## 2014-09-21 ENCOUNTER — Encounter: Payer: Self-pay | Admitting: Emergency Medicine

## 2014-09-21 DIAGNOSIS — Y9389 Activity, other specified: Secondary | ICD-10-CM | POA: Diagnosis not present

## 2014-09-21 DIAGNOSIS — Y288XXA Contact with other sharp object, undetermined intent, initial encounter: Secondary | ICD-10-CM | POA: Diagnosis not present

## 2014-09-21 DIAGNOSIS — Z79899 Other long term (current) drug therapy: Secondary | ICD-10-CM | POA: Insufficient documentation

## 2014-09-21 DIAGNOSIS — Y998 Other external cause status: Secondary | ICD-10-CM | POA: Diagnosis not present

## 2014-09-21 DIAGNOSIS — Z7982 Long term (current) use of aspirin: Secondary | ICD-10-CM | POA: Insufficient documentation

## 2014-09-21 DIAGNOSIS — Y9289 Other specified places as the place of occurrence of the external cause: Secondary | ICD-10-CM | POA: Insufficient documentation

## 2014-09-21 DIAGNOSIS — Z720 Tobacco use: Secondary | ICD-10-CM | POA: Insufficient documentation

## 2014-09-21 DIAGNOSIS — Z23 Encounter for immunization: Secondary | ICD-10-CM | POA: Diagnosis not present

## 2014-09-21 DIAGNOSIS — S91115A Laceration without foreign body of left lesser toe(s) without damage to nail, initial encounter: Secondary | ICD-10-CM | POA: Diagnosis not present

## 2014-09-21 MED ORDER — CEPHALEXIN 500 MG PO CAPS: 500.0000 mg | ORAL_CAPSULE | Freq: Three times a day (TID) | ORAL | Status: AC

## 2014-09-21 MED ORDER — TETANUS-DIPHTH-ACELL PERTUSSIS 5-2.5-18.5 LF-MCG/0.5 IM SUSP
INTRAMUSCULAR | Status: AC
  Administered 2014-09-21: 0.5 mL via INTRAMUSCULAR
  Filled 2014-09-21: qty 0.5

## 2014-09-21 MED ORDER — TETANUS-DIPHTH-ACELL PERTUSSIS 5-2.5-18.5 LF-MCG/0.5 IM SUSP
INTRAMUSCULAR | Status: AC
  Filled 2014-09-21: qty 0.5

## 2014-09-21 MED ORDER — TETANUS-DIPHTH-ACELL PERTUSSIS 5-2.5-18.5 LF-MCG/0.5 IM SUSP
0.5000 mL | Freq: Once | INTRAMUSCULAR | Status: AC
  Administered 2014-09-21: 0.5 mL via INTRAMUSCULAR

## 2014-09-21 NOTE — ED Notes (Signed)
Pt states he was walking with a orthopedic device on left foot from previous injury, states he does not have any feeling to left foot from previous injury and nerve damage and states he did not see what he stepped on but states he has laceration to to left 2nd toe

## 2014-09-21 NOTE — ED Notes (Addendum)
Pt presents with a laceration on the bottom of the second toe on the left foot. Bleeding is stopped, laceration seems to follow the skin fold line between the second and most distal toe joint. Pt denies pain d/t a knee injury that required surgery and left the pt with some nerve damage and decreased feeling in his lower extremity.

## 2014-09-21 NOTE — ED Provider Notes (Signed)
[facility] Emergency Department Provider Note ____________________________________________  Time seen: Approximately 10:38 PM  I have reviewed the triage vital signs and the nursing notes.   HISTORY  Chief Complaint Laceration   HPI [name] is a 39 y.o. male presents to the emergency department for a laceration to the plantar aspect of his left foot on the second digit. He reports that he has no feeling in his feet and must have stepped on something. He does not know when the incident happened. He states that it likely happened yesterday. He states that he noticed the blood in the floor this afternoon and noticed that it continued to bleed throughout the evening. He currently has a "bone infection" and is being treated by the ID clinic at [facility].He finished his last round of antibiotics 1 month ago and has a follow-up appointment this coming Tuesday.   History reviewed. No pertinent past medical history.  Patient Active Problem List   Diagnosis Date Noted  . Left knee dislocation 03/03/2014  . Acute blood loss anemia 03/03/2014  . MRSA infection 03/03/2014  . Hereditary and idiopathic peripheral neuropathy 03/03/2014    No past surgical history on file.  Current Outpatient Rx  Name  Route  Sig  Dispense  Refill  . aspirin 81 MG tablet   Oral   Take 81 mg by mouth daily.         . celecoxib (CELEBREX) 200 MG capsule   Oral   Take 200 mg by mouth 2 (two) times daily.         . cephALEXin (KEFLEX) 500 MG capsule   Oral   Take 1 capsule (500 mg total) by mouth 3 (three) times daily.   40 capsule   0   . cyanocobalamin 500 MCG tablet   Oral   Take 500 mcg by mouth daily.         . DULoxetine (CYMBALTA) 60 MG capsule   Oral   Take 60 mg by mouth daily.         . ferrous sulfate 325 (65 FE) MG EC tablet   Oral   Take 325 mg by mouth daily with breakfast.         . gabapentin (NEURONTIN) 600 MG tablet   Oral   Take 600 mg by  mouth 3 (three) times daily.         Marland Kitchen omeprazole (PRILOSEC) 20 MG capsule   Oral   Take 20 mg by mouth daily.         Marland Kitchen oxycodone (OXY-IR) 5 MG capsule   Oral   Take 10 mg by mouth every 3 (three) hours as needed.         . Oxycodone HCl 10 MG TABS      Take one tablet by mouth every 4 hours as needed for pain   180 tablet   0   . OxyCODONE HCl ER (OXYCONTIN) 30 MG T12A      Take one tablet by mouth every 12 hours for pain. Do not crush   60 each   0     Allergies Review of patient's allergies indicates no known allergies.  No family history on file.  Social History History  Substance Use Topics  . Smoking status: Current Every Day Smoker  . Smokeless tobacco: Not on file  . Alcohol Use: 0.0 oz/week    0 Standard drinks or equivalent per week    Review of Systems Constitutional: No recent illness.  Eyes: No visual changes. ENT: No sore throat. Cardiovascular: Denies chest pain or palpitations. Respiratory: Denies shortness of breath. Gastrointestinal: No abdominal pain.  Genitourinary: Negative for dysuria. Musculoskeletal: Unable to detect pain in the left lower extremity. Skin: Laceration to plantar aspect of the left foot second toe Neurological: Negative for headaches, focal weakness or numbness. 10-point ROS otherwise negative.  ____________________________________________   PHYSICAL EXAM:  VITAL SIGNS: ED Triage Vitals  Enc Vitals Group     BP 09/21/14 1849 166/119 mmHg     Pulse Rate 09/21/14 1849 98     Resp 09/21/14 1849 18     Temp 09/21/14 1849 97.6 F (36.4 C)     Temp Source 09/21/14 1849 Oral     SpO2 09/21/14 1849 97 %     Weight 09/21/14 1849 270 lb (122.471 kg)     Height 09/21/14 1849 5\' 10"  (1.778 m)     Head Cir --      Peak Flow --      Pain Score 09/21/14 2007 0     Pain Loc --      Pain Edu? --      Excl. in GC? --     Constitutional: Alert and oriented. Well appearing and in no acute distress. Eyes:  Conjunctivae are normal. EOMI. Head: Atraumatic. Nose: No congestion/rhinnorhea. Neck: No stridor.  Respiratory: Normal respiratory effort.   Musculoskeletal: Full range of motion of foot and toes. Neurologic:  Normal speech and language. No gross focal neurologic deficits are appreciated. Speech is normal. No gait instability. Skin:  Skin is warm, dry and intact. Approximate 1.5 cm laceration present to the skin fold just past the pad of the second digit. Scant bleeding present on exam. Psychiatric: Mood and affect are normal. Speech and behavior are normal.  ____________________________________________   LABS (all labs ordered are listed, but only abnormal results are displayed)  Labs Reviewed - No data to display ____________________________________________  RADIOLOGY  No bony abnormality or evidence of infection. ____________________________________________   PROCEDURES  Procedure(s) performed:   Surgicel inserted into the wound of the 2nd digit of left foot after irrigating with betadine and saline. Wound wrapped with rolled gauze. No bleeding present.   ____________________________________________   INITIAL IMPRESSION / ASSESSMENT AND PLAN / ED COURSE  Pertinent labs & imaging results that were available during my care of the patient were reviewed by me and considered in my medical decision making (see chart for details).  Patient was strongly advised to keep the wound clean and dry. A hospital sock was applied over the foot as the patient was not wearing any socks upon arrival. He was also advised to follow-up with his specialist at [facility] on Tuesday as scheduled. He was also strongly advised to take the antibiotics as prescribed without skipping any doses. He was advised to keep the foot elevated and avoid walking as much as possible until follow-up. He is advised to return to the ER immediately for any evidence of wound  infection. ____________________________________________   FINAL CLINICAL IMPRESSION(S) / ED DIAGNOSES  Final diagnoses:  Laceration of second toe of left foot, initial encounter      Chinita Pester, FNP 09/21/14 2246  Emily Filbert, MD 09/21/14 [phone]

## 2018-06-02 ENCOUNTER — Encounter: Payer: Self-pay | Admitting: Podiatry

## 2018-06-02 ENCOUNTER — Ambulatory Visit (INDEPENDENT_AMBULATORY_CARE_PROVIDER_SITE_OTHER): Payer: Medicaid Other

## 2018-06-02 ENCOUNTER — Other Ambulatory Visit: Payer: Self-pay | Admitting: Podiatry

## 2018-06-02 ENCOUNTER — Ambulatory Visit (INDEPENDENT_AMBULATORY_CARE_PROVIDER_SITE_OTHER): Payer: Medicaid Other | Admitting: Podiatry

## 2018-06-02 DIAGNOSIS — E0843 Diabetes mellitus due to underlying condition with diabetic autonomic (poly)neuropathy: Secondary | ICD-10-CM | POA: Diagnosis not present

## 2018-06-02 DIAGNOSIS — L97529 Non-pressure chronic ulcer of other part of left foot with unspecified severity: Principal | ICD-10-CM

## 2018-06-02 DIAGNOSIS — L97522 Non-pressure chronic ulcer of other part of left foot with fat layer exposed: Secondary | ICD-10-CM

## 2018-06-02 DIAGNOSIS — E11621 Type 2 diabetes mellitus with foot ulcer: Secondary | ICD-10-CM | POA: Diagnosis not present

## 2018-06-05 NOTE — Progress Notes (Signed)
   Subjective:  43 year old male with PMHx of DM presenting today as a new patient with a chief complaint of a painful blister noted to the left plantar forefoot that appeared 2-3 months ago. He reports associated bleeding. Walking increases the symptoms. He also notes left dropfoot and nerve damage. He has not done anything for treatment. Patient is here for further evaluation and treatment.   History reviewed. No pertinent past medical history.    Objective/Physical Exam General: The patient is alert and oriented x3 in no acute distress.  Dermatology:  Wound #1 noted to the left foot measuring 1.0 x 0.8 x 0.2 cm (LxWxD).   To the noted ulceration(s), there is no eschar. There is a moderate amount of slough, fibrin, and necrotic tissue noted. Granulation tissue and wound base is red. There is a minimal amount of serosanguineous drainage noted. There is no exposed bone muscle-tendon ligament or joint. There is no malodor. Periwound integrity is intact. Skin is warm, dry and supple bilateral lower extremities.  Vascular: Palpable pedal pulses bilaterally. No erythema noted. Capillary refill within normal limits.  Neurological: Epicritic and protective threshold diminished bilaterally.   Musculoskeletal Exam: Edema noted to the left ankle. Left dropfoot noted. Range of motion within normal limits to all pedal and ankle joints bilateral. Muscle strength 5/5 in all groups bilateral.   Radiographic Exam:  Normal osseous mineralization. Joint spaces preserved. No fracture/dislocation/boney destruction.    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 X-Rays reviewed.  #2 medically necessary excisional debridement including subcutaneous tissue was performed using a tissue nipper and a chisel blade. Excisional  debridement of all the necrotic nonviable tissue down to healthy bleeding viable tissue was performed with post-debridement measurements same as pre-. #3 the wound was cleansed and dry sterile dressing applied. #4 Recommended Betadine daily with a dry sterile dressing.  #5 Compression anklet dispensed.  #6 patient is to return to clinic in 3 weeks.   Felecia Shelling, DPM [facility]  Dr. Felecia Shelling, DPM    [address]                                        [place], [place] [address]                Office [phone]  Fax [phone]

## 2018-06-23 ENCOUNTER — Other Ambulatory Visit: Payer: Self-pay

## 2018-06-23 ENCOUNTER — Ambulatory Visit (INDEPENDENT_AMBULATORY_CARE_PROVIDER_SITE_OTHER): Payer: PRIVATE HEALTH INSURANCE | Admitting: Podiatry

## 2018-06-23 ENCOUNTER — Encounter: Payer: Self-pay | Admitting: Podiatry

## 2018-06-23 DIAGNOSIS — E11621 Type 2 diabetes mellitus with foot ulcer: Secondary | ICD-10-CM | POA: Diagnosis not present

## 2018-06-23 DIAGNOSIS — M65972 Unspecified synovitis and tenosynovitis, left ankle and foot: Secondary | ICD-10-CM

## 2018-06-23 DIAGNOSIS — E0843 Diabetes mellitus due to underlying condition with diabetic autonomic (poly)neuropathy: Secondary | ICD-10-CM | POA: Diagnosis not present

## 2018-06-23 DIAGNOSIS — L97522 Non-pressure chronic ulcer of other part of left foot with fat layer exposed: Secondary | ICD-10-CM

## 2018-06-23 DIAGNOSIS — L97529 Non-pressure chronic ulcer of other part of left foot with unspecified severity: Secondary | ICD-10-CM

## 2018-06-23 DIAGNOSIS — M659 Synovitis and tenosynovitis, unspecified: Secondary | ICD-10-CM | POA: Diagnosis not present

## 2018-06-23 NOTE — Progress Notes (Signed)
   Subjective:  43 year old male with PMHx of DM presenting today for follow-up treatment and evaluation regarding an ulcer to the left foot.  Patient has been applying Betadine with dry sterile dressings.  He continues to complain of left ankle pain that is been ongoing for several months now.  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  Patient is undergone reconstructive surgeries to the left knee.  He presents for follow-up treatment evaluation  History reviewed. No pertinent past medical history.    Objective/Physical Exam General: The patient is alert and oriented x3 in no acute distress.  Dermatology:  Wound #1 noted to the left foot measuring 0.8 x 0.8 x 0.2 cm (LxWxD).   To the noted ulceration(s), there is no eschar. There is a moderate amount of slough, fibrin, and necrotic tissue noted. Granulation tissue and wound base is red. There is a minimal amount of serosanguineous drainage noted. There is no exposed bone muscle-tendon ligament or joint. There is no malodor. Periwound integrity is intact. Skin is warm, dry and supple bilateral lower extremities.  Vascular: Palpable pedal pulses bilaterally. No erythema noted. Capillary refill within normal limits.  Neurological: Epicritic and protective threshold diminished bilaterally.   Musculoskeletal Exam: Edema noted to the left ankle. Left dropfoot noted.  Pain on palpation noted to the lateral aspect of the patient's left ankle joint.  Range of motion within normal limits to all pedal and ankle joints bilateral. Muscle strength 5/5 in all groups bilateral.   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 X-Rays reviewed.  #2 medically necessary excisional debridement including subcutaneous  tissue was performed using a tissue nipper and a chisel blade. Excisional debridement of all the necrotic nonviable tissue down to healthy bleeding viable tissue was performed with post-debridement measurements same as pre-. #3 the wound was cleansed and dry sterile dressing applied. #4  Continue Betadine daily with a dry sterile dressing.  #5  Continue compression anklet #6  Postoperative shoe dispensed.  #7 injection of 0.5 cc Celestone Soluspan injection of the patient's left ankle joint.  Lateral aspect. #8 return to clinic in 3 weeks   Felecia Shelling, DPM [facility]  Dr. Felecia Shelling, DPM    [address]                                        [place], [place] [address]                Office [phone]  Fax [phone]

## 2018-07-21 ENCOUNTER — Encounter: Payer: Self-pay | Admitting: Podiatry

## 2018-07-21 ENCOUNTER — Other Ambulatory Visit: Payer: Self-pay

## 2018-07-21 ENCOUNTER — Ambulatory Visit (INDEPENDENT_AMBULATORY_CARE_PROVIDER_SITE_OTHER): Payer: Medicaid Other | Admitting: Podiatry

## 2018-07-21 VITALS — Temp 98.2°F

## 2018-07-21 DIAGNOSIS — E0843 Diabetes mellitus due to underlying condition with diabetic autonomic (poly)neuropathy: Secondary | ICD-10-CM

## 2018-07-21 DIAGNOSIS — E11621 Type 2 diabetes mellitus with foot ulcer: Secondary | ICD-10-CM

## 2018-07-21 DIAGNOSIS — L97522 Non-pressure chronic ulcer of other part of left foot with fat layer exposed: Secondary | ICD-10-CM

## 2018-07-21 DIAGNOSIS — M659 Synovitis and tenosynovitis, unspecified: Secondary | ICD-10-CM | POA: Diagnosis not present

## 2018-07-21 DIAGNOSIS — L97529 Non-pressure chronic ulcer of other part of left foot with unspecified severity: Secondary | ICD-10-CM

## 2018-07-24 LAB — WOUND CULTURE: Organism ID, Bacteria: NONE SEEN

## 2018-07-25 NOTE — Progress Notes (Signed)
   Subjective:  43 year old male with PMHx of DM presenting today for follow-up treatment and evaluation regarding an ulcer to the left foot.   No past medical history on file.    Objective/Physical Exam General: The patient is alert and oriented x3 in no acute distress.  Dermatology:  Wound #1 noted to the left foot measuring 0.7 x 0.8 x 0.2 cm (LxWxD).   To the noted ulceration(s), there is no eschar. There is a moderate amount of slough, fibrin, and necrotic tissue noted. Granulation tissue and wound base is red. There is a minimal amount of serosanguineous drainage noted. There is no exposed bone muscle-tendon ligament or joint. There is no malodor. Periwound integrity is intact. Skin is warm, dry and supple bilateral lower extremities.  Vascular: Palpable pedal pulses bilaterally. No erythema noted. Capillary refill within normal limits.  Neurological: Epicritic and protective threshold diminished bilaterally.   Musculoskeletal Exam: Edema noted to the left ankle. Left dropfoot noted.  Pain on palpation noted to the lateral aspect of the patient's left ankle joint.  Range of motion within normal limits to all pedal and ankle joints bilateral. Muscle strength 5/5 in all groups bilateral.   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 #2 medically necessary excisional debridement including subcutaneous tissue was performed using a tissue nipper and a chisel blade. Excisional debridement of all the necrotic nonviable tissue down to healthy bleeding viable tissue was performed with post-debridement measurements same as pre-. #3 the wound was cleansed and dry sterile dressing applied. #4  Continue Betadine daily with a dry sterile dressing.  #5  Continue compression anklet #6  Continue  wearing postoperative shoe #7 return to clinic in 4 weeks.  At this time the patient may want to readdress the left ankle for possible injection   Felecia Shelling, DPM [facility]  Dr. Felecia Shelling, DPM    [address]                                        [place], [place] [address]                Office [phone]  Fax [phone]

## 2018-07-28 ENCOUNTER — Other Ambulatory Visit: Payer: Self-pay | Admitting: Podiatry

## 2018-07-28 MED ORDER — DOXYCYCLINE HYCLATE 100 MG PO TABS: 100.0000 mg | ORAL_TABLET | Freq: Two times a day (BID) | ORAL | 0 refills | Status: DC

## 2018-07-28 NOTE — Progress Notes (Signed)
[facility]

## 2018-08-18 ENCOUNTER — Other Ambulatory Visit: Payer: Self-pay

## 2018-08-18 ENCOUNTER — Ambulatory Visit (INDEPENDENT_AMBULATORY_CARE_PROVIDER_SITE_OTHER): Payer: Medicaid Other

## 2018-08-18 ENCOUNTER — Ambulatory Visit (INDEPENDENT_AMBULATORY_CARE_PROVIDER_SITE_OTHER): Payer: PRIVATE HEALTH INSURANCE | Admitting: Podiatry

## 2018-08-18 VITALS — Temp 98.4°F | Resp 16

## 2018-08-18 DIAGNOSIS — L97522 Non-pressure chronic ulcer of other part of left foot with fat layer exposed: Secondary | ICD-10-CM | POA: Diagnosis not present

## 2018-08-18 DIAGNOSIS — M79676 Pain in unspecified toe(s): Secondary | ICD-10-CM

## 2018-08-18 DIAGNOSIS — E0843 Diabetes mellitus due to underlying condition with diabetic autonomic (poly)neuropathy: Secondary | ICD-10-CM

## 2018-08-18 MED ORDER — DOXYCYCLINE HYCLATE 100 MG PO TABS: 100.0000 mg | ORAL_TABLET | Freq: Two times a day (BID) | ORAL | 0 refills | Status: DC

## 2018-08-21 LAB — WOUND CULTURE: Organism ID, Bacteria: NONE SEEN

## 2018-08-22 NOTE — Progress Notes (Signed)
   Subjective:  43 year old male with PMHx of DM presenting today for follow-up evaluation of a wound of the left foot and left ankle pain. He reports continued and increased drainage and states the ulceration looks worse. He has been applying Betadine and applying a bandage to the wound. He has been taking the Doxycycline as directed. There are no worsening factors noted. Patient is here for further evaluation and treatment.    No past medical history on file.    Objective/Physical Exam General: The patient is alert and oriented x3 in no acute distress.  Dermatology:  Wound #1 noted to the left foot measuring 1.5 x 1.0 x 3.0 cm (LxWxD).   To the noted ulceration(s), there is no eschar. There is a moderate amount of slough, fibrin, and necrotic tissue noted. Granulation tissue and wound base is red. There is a minimal amount of serosanguineous drainage noted. Wound probes to bone. There is no malodor. Periwound integrity is intact. Skin is warm, dry and supple bilateral lower extremities.  Vascular: Palpable pedal pulses bilaterally. No erythema noted. Capillary refill within normal limits.  Neurological: Epicritic and protective threshold diminished bilaterally.   Musculoskeletal Exam: Edema noted to the left ankle. Left dropfoot noted.  Pain on palpation noted to the lateral aspect of the patient's left ankle joint.  Range of motion within normal limits to all pedal and ankle joints bilateral. Muscle strength 5/5 in all groups bilateral.   Radiographic Exam:  No evidence of osteomyelitis. Normal osseous mineralization. Joint spaces preserved. No fracture/dislocation/boney destruction.    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 X-Rays reviewed.  #2 medically necessary excisional  debridement including subcutaneous tissue was performed using a tissue nipper and a chisel blade. Excisional debridement of all the necrotic nonviable tissue down to healthy bleeding viable tissue was performed with post-debridement measurements same as pre-. #3 the wound was cleansed and dry sterile dressing applied. #4 Culture taken from wound.  #5 Continue taking oral antibiotic Doxycycline 100 mg #20 based on prior cultures.  #6 Continue Betadine daily with dry sterile dressing.  #7 Discontinue using post op shoe. CAM boot dispensed.  #8 Return to clinic in 2 weeks.    Felecia Shelling, DPM [facility]  Dr. Felecia Shelling, DPM    [address]                                        [place], [place] [address]                Office [phone]  Fax [phone]

## 2018-09-01 ENCOUNTER — Ambulatory Visit: Payer: PRIVATE HEALTH INSURANCE | Admitting: Podiatry

## 2018-09-01 ENCOUNTER — Telehealth: Payer: Self-pay

## 2018-09-01 MED ORDER — DOXYCYCLINE HYCLATE 100 MG PO TABS: 100.0000 mg | ORAL_TABLET | Freq: Two times a day (BID) | ORAL | 0 refills | Status: DC

## 2018-09-01 NOTE — Telephone Encounter (Signed)
Patient called and cancelled appt for today due to not feeling well.  He is requesting another round of antibiotics to cover him until his next appt.   Per Dr. Logan Bores verbal order, ok to refill Doxycycline.  Script has been sent to pharmacy and patient has been notified.

## 2018-09-15 ENCOUNTER — Encounter: Payer: Self-pay | Admitting: Podiatry

## 2018-09-15 ENCOUNTER — Other Ambulatory Visit: Payer: Self-pay

## 2018-09-15 ENCOUNTER — Ambulatory Visit (INDEPENDENT_AMBULATORY_CARE_PROVIDER_SITE_OTHER): Payer: PRIVATE HEALTH INSURANCE | Admitting: Podiatry

## 2018-09-15 VITALS — Temp 98.0°F

## 2018-09-15 DIAGNOSIS — L97522 Non-pressure chronic ulcer of other part of left foot with fat layer exposed: Secondary | ICD-10-CM

## 2018-09-15 DIAGNOSIS — E0843 Diabetes mellitus due to underlying condition with diabetic autonomic (poly)neuropathy: Secondary | ICD-10-CM

## 2018-09-15 MED ORDER — DOXYCYCLINE HYCLATE 100 MG PO TABS: 100.0000 mg | ORAL_TABLET | Freq: Two times a day (BID) | ORAL | 0 refills | Status: DC

## 2018-09-17 NOTE — Progress Notes (Signed)
   Subjective:  43 year old male with PMHx of DM presenting today for follow-up evaluation of a wound of the left foot. He is unsure if the wound is healing appropriately. He reports some purulent drainage last week. He has been using the Betadine ointment and taking Doxycycline for treatment. There are no modifying factors noted. Patient is here for further evaluation and treatment.  History reviewed. No pertinent past medical history.    Objective/Physical Exam General: The patient is alert and oriented x3 in no acute distress.  Dermatology:  Wound #1 noted to the left foot measuring 2.5 x 2.5 x 1.0 cm (LxWxD).   To the noted ulceration(s), there is no eschar. There is a moderate amount of slough, fibrin, and necrotic tissue noted. Granulation tissue and wound base is red. There is a minimal amount of serosanguineous drainage noted. Tendon exposed. There is no malodor. Periwound integrity is intact. Skin is warm, dry and supple bilateral lower extremities.  Vascular: Palpable pedal pulses bilaterally. No erythema noted. Capillary refill within normal limits.  Neurological: Epicritic and protective threshold diminished bilaterally.   Musculoskeletal Exam: Left dropfoot noted. Range of motion within normal limits to all pedal and ankle joints bilateral. Muscle strength 5/5 in all groups bilateral.   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  #2 Medically necessary excisional debridement including muscle and deep fascial tissue was performed using a tissue nipper and a chisel blade. Excisional debridement of all the necrotic nonviable tissue down to healthy bleeding viable tissue was performed with post-debridement measurements same as pre-. #3 The wound was cleansed and dry  sterile dressing applied. #4 Continue using Betadine ointment daily with a dry sterile dressing.  #5 Continue weightbearing in CAM boot.  #6 Referral placed for patient to go to the wound care center at [facility].  #7 Refill prescription for Doxycycline 100 mg provided to patient.  #8 Return to clinic as needed.     Edrick Kins, DPM [facility]  Dr. Edrick Kins, [address]                                        [place], [place] [address]                Office [phone]  Fax [phone]

## 2018-09-21 ENCOUNTER — Telehealth: Payer: Self-pay

## 2018-09-21 DIAGNOSIS — L97522 Non-pressure chronic ulcer of other part of left foot with fat layer exposed: Secondary | ICD-10-CM

## 2018-09-21 DIAGNOSIS — E11621 Type 2 diabetes mellitus with foot ulcer: Secondary | ICD-10-CM

## 2018-09-21 NOTE — Telephone Encounter (Signed)
-----   Message from Edrick Kins, DPM sent at 09/15/2018  1:12 PM EDT ----- Regarding: referral to [facility] care center Please refer patient to the [facility] wound care center.   - Dr. Amalia Hailey

## 2018-09-21 NOTE — Telephone Encounter (Signed)
Referral has been placed in chart.   

## 2018-09-22 ENCOUNTER — Encounter: Payer: Medicaid Other | Attending: Physician Assistant | Admitting: Physician Assistant

## 2018-09-22 ENCOUNTER — Other Ambulatory Visit: Payer: Self-pay

## 2018-09-22 DIAGNOSIS — M21372 Foot drop, left foot: Secondary | ICD-10-CM | POA: Diagnosis not present

## 2018-09-22 DIAGNOSIS — Z8249 Family history of ischemic heart disease and other diseases of the circulatory system: Secondary | ICD-10-CM | POA: Insufficient documentation

## 2018-09-22 DIAGNOSIS — I10 Essential (primary) hypertension: Secondary | ICD-10-CM | POA: Diagnosis not present

## 2018-09-22 DIAGNOSIS — Z823 Family history of stroke: Secondary | ICD-10-CM | POA: Insufficient documentation

## 2018-09-22 DIAGNOSIS — Z809 Family history of malignant neoplasm, unspecified: Secondary | ICD-10-CM | POA: Diagnosis not present

## 2018-09-22 DIAGNOSIS — Z8349 Family history of other endocrine, nutritional and metabolic diseases: Secondary | ICD-10-CM | POA: Insufficient documentation

## 2018-09-22 DIAGNOSIS — Z833 Family history of diabetes mellitus: Secondary | ICD-10-CM | POA: Diagnosis not present

## 2018-09-22 DIAGNOSIS — E11621 Type 2 diabetes mellitus with foot ulcer: Secondary | ICD-10-CM | POA: Insufficient documentation

## 2018-09-22 DIAGNOSIS — L97522 Non-pressure chronic ulcer of other part of left foot with fat layer exposed: Secondary | ICD-10-CM | POA: Diagnosis not present

## 2018-09-22 DIAGNOSIS — E1140 Type 2 diabetes mellitus with diabetic neuropathy, unspecified: Secondary | ICD-10-CM | POA: Insufficient documentation

## 2018-09-23 NOTE — Progress Notes (Signed)
ISSAI, WERLING ([id number]) Visit Report for 09/22/2018 Abuse/Suicide Risk Screen Details Patient Name: [name], [name] Date of Service: 09/22/2018 2:15 PM Medical Record Number: [id number] Patient Account Number: [id number] Date of Birth/Sex: Apr 09, 1975 (43 y.o. M) Treating RN: Army Melia Primary Care Takoya Jonas: Pernell Dupre Other Clinician: Referring Tierre Gerard: Daylene Katayama Treating Trenee Igoe/Extender: Melburn Hake, HOYT Weeks in Treatment: 0 Abuse/Suicide Risk Screen Items Answer ABUSE RISK SCREEN: Has anyone close to you tried to hurt or harm you recentlyo No Do you feel uncomfortable with anyone in your familyo No Has anyone forced you do things that you didnot want to doo No Electronic Signature(s) Signed: 09/22/2018 3:22:52 PM By: Army Melia Entered By: Army Melia on 09/22/2018 14:32:25 [name], [name] ([id number]) -------------------------------------------------------------------------------- Activities of Daily Living Details Patient Name: [name], [name] Date of Service: 09/22/2018 2:15 PM Medical Record Number: [id number] Patient Account Number: [id number] Date of Birth/Sex: 05-25-1975 (43 y.o. M) Treating RN: Army Melia Primary Care Ahmari Garton: Pernell Dupre Other Clinician: Referring Anahli Arvanitis: Daylene Katayama Treating Koda Defrank/Extender: Melburn Hake, HOYT Weeks in Treatment: 0 Activities of Daily Living Items Answer Activities of Daily Living (Please select one for each item) Drive Automobile Completely Able Take Medications Completely Able Use Telephone Completely Able Care for Appearance Completely Able Use Toilet Completely Able Bath / Shower Completely Able Dress Self Completely Able Feed Self Completely Able Walk Completely Able Get In / Out Bed Completely Able Housework Completely Able Prepare Meals Completely [place] for Self Completely Able Electronic Signature(s) Signed: 09/22/2018 3:22:52 PM By: Army Melia Entered By: Army Melia on 09/22/2018 14:32:42 [name], [name] ([id number]) -------------------------------------------------------------------------------- Education Screening Details Patient Name: [name], [name] Date of Service: 09/22/2018 2:15 PM Medical Record Number: [id number] Patient Account Number: [id number] Date of Birth/Sex: September 12, 1975 (43 y.o. M) Treating RN: Army Melia Primary Care Fatin Bachicha: Pernell Dupre Other Clinician: Referring Trenell Moxey: Daylene Katayama Treating Ludivina Guymon/Extender: Sharalyn Ink in Treatment: 0 Primary Learner Assessed: Patient Learning Preferences/Education Level/Primary Language Learning Preference: Explanation Highest Education Level: High School Preferred Language: English Cognitive Barrier Language Barrier: No Translator Needed: No Memory Deficit: No Emotional Barrier: No Cultural/Religious Beliefs Affecting Medical Care: No Physical Barrier Impaired Vision: No Impaired Hearing: No Decreased Hand dexterity: No Knowledge/Comprehension Knowledge Level: High Comprehension Level: High Ability to understand written High instructions: Ability to understand verbal High instructions: Motivation Anxiety Level: Calm Cooperation: Cooperative Education Importance: Acknowledges Need Interest in Health Problems: Asks Questions Perception: Coherent Willingness to Engage in Self- High Management Activities: Readiness to Engage in Self- High Management Activities: Electronic Signature(s) Signed: 09/22/2018 3:22:52 PM By: Army Melia Entered By: Army Melia on 09/22/2018 14:33:03 [name], [name] ([id number]) -------------------------------------------------------------------------------- Fall Risk Assessment Details Patient Name: [name], [name] Date of Service: 09/22/2018 2:15 PM Medical Record Number: [id number] Patient Account Number: [id number] Date of Birth/Sex: Aug 06, 1975 (43 y.o. M) Treating RN: Army Melia Primary Care Jehan Bonano: Pernell Dupre Other Clinician: Referring Emaad Nanna: Daylene Katayama Treating Cree Napoli/Extender: Melburn Hake, HOYT Weeks in Treatment: 0 Fall Risk Assessment Items Have you had 2 or more falls in the last 12 monthso 0 No Have you had any fall that resulted in injury in the last 12 monthso 0 No FALLS RISK SCREEN History of falling - immediate or within 3 months 0 No Secondary diagnosis (Do you have 2 or more medical diagnoseso) 0 No Ambulatory aid None/bed rest/wheelchair/nurse 0 No Crutches/cane/walker 0 No Furniture 0 No Intravenous therapy Access/Saline/Heparin Lock 0 No Gait/Transferring Normal/ bed rest/ wheelchair 0 No Weak (short  steps with or without shuffle, stooped but able to lift head while 0 No walking, may seek support from furniture) Impaired (short steps with shuffle, may have difficulty arising from chair, head 0 No down, impaired balance) Mental Status Oriented to own ability 0 No Electronic Signature(s) Signed: 09/22/2018 3:22:52 PM By: Rodell PernaScott, Dajea Entered By: Rodell PernaScott, Dajea on 09/22/2018 14:33:10 [name], [name] ([id number]) -------------------------------------------------------------------------------- Foot Assessment Details Patient Name: [name], [name] Date of Service: 09/22/2018 2:15 PM Medical Record Number: [id number] Patient Account Number: [id number] Date of Birth/Sex: March 19, 1976 (43 y.o. M) Treating RN: Rodell PernaScott, Dajea Primary Care Miria Cappelli: Bluford Mainejan-Sie, Sheikh Other Clinician: Referring Akil Hoos: Gala LewandowskyEVANS, BRENT Treating France Noyce/Extender: Linwood DibblesSTONE III, HOYT Weeks in Treatment: 0 Foot Assessment Items Site Locations + = Sensation present, - = Sensation absent, C = Callus, U = Ulcer R = Redness, W = Warmth, M = Maceration, PU = Pre-ulcerative lesion F = Fissure, S = Swelling, D = Dryness Assessment Right: Left: Other Deformity: No No Prior Foot Ulcer: No No Prior Amputation: No No Charcot Joint: No No Ambulatory  Status: Ambulatory Without Help Gait: Steady Electronic Signature(s) Signed: 09/22/2018 3:22:52 PM By: Rodell PernaScott, Dajea Entered By: Rodell PernaScott, Dajea on 09/22/2018 14:37:13 [name], [name] ([id number]) -------------------------------------------------------------------------------- Nutrition Risk Screening Details Patient Name: [name], [name] Date of Service: 09/22/2018 2:15 PM Medical Record Number: [id number] Patient Account Number: [id number] Date of Birth/Sex: March 19, 1976 (43 y.o. M) Treating RN: Rodell PernaScott, Dajea Primary Care Juanito Gonyer: Bluford Mainejan-Sie, Sheikh Other Clinician: Referring Ramesha Poster: Gala LewandowskyEVANS, BRENT Treating Rhegan Trunnell/Extender: Linwood DibblesSTONE III, HOYT Weeks in Treatment: 0 Height (in): 69 Weight (lbs): 230 Body Mass Index (BMI): 34 Nutrition Risk Screening Items Score Screening NUTRITION RISK SCREEN: I have an illness or condition that made me change the kind and/or amount of 0 No food I eat I eat fewer than two meals per day 0 No I eat few fruits and vegetables, or milk products 0 No I have three or more drinks of beer, liquor or wine almost every day 0 No I have tooth or mouth problems that make it hard for me to eat 0 No I don't always have enough money to buy the food I need 0 No I eat alone most of the time 0 No I take three or more different prescribed or over-the-counter drugs a day 0 No Without wanting to, I have lost or gained 10 pounds in the last six months 0 No I am not always physically able to shop, cook and/or feed myself 0 No Nutrition Protocols Good Risk Protocol 0 No interventions needed Moderate Risk Protocol High Risk Proctocol Risk Level: Good Risk Score: 0 Electronic Signature(s) Signed: 09/22/2018 3:22:52 PM By: Rodell PernaScott, Dajea Entered By: Rodell PernaScott, Dajea on 09/22/2018 14:33:17

## 2018-09-24 NOTE — Progress Notes (Signed)
[name], Artez A. ([id number]) Visit Report for 09/22/2018 Allergy List Details Patient Name: Liera, Marqueze A. Date of Service: 09/22/2018 2:15 PM Medical Record Number: [id number] Patient Account Number: [id number] Date of Birth/Sex: 1976/01/28 (43 y.o. M) Treating RN: Rodell PernaScott, Dajea Primary Care Adin Lariccia: Bluford Mainejan-Sie, Sheikh Other Clinician: Referring Mehr Depaoli: Gala LewandowskyEVANS, BRENT Treating Stone Spirito/Extender: Linwood DibblesSTONE III, HOYT Weeks in Treatment: 0 Allergies Active Allergies no known allergies Allergy Notes Electronic Signature(s) Signed: 09/22/2018 3:22:52 PM By: Rodell PernaScott, Dajea Entered By: Rodell PernaScott, Dajea on 09/22/2018 14:27:07 Amico, Charbel A. ([id number]) -------------------------------------------------------------------------------- Arrival Information Details Patient Name: Eberwein, Nina A. Date of Service: 09/22/2018 2:15 PM Medical Record Number: [id number] Patient Account Number: [id number] Date of Birth/Sex: 1976/01/28 (43 y.o. M) Treating RN: Rodell PernaScott, Dajea Primary Care Brytani Voth: Bluford Mainejan-Sie, Sheikh Other Clinician: Referring Kwadwo Taras: Gala LewandowskyEVANS, BRENT Treating Jasir Rother/Extender: Skeet SimmerSTONE III, HOYT Weeks in Treatment: 0 Visit Information Patient Arrived: Ambulatory Arrival Time: 14:25 Accompanied By: self Transfer Assistance: None Electronic Signature(s) Signed: 09/22/2018 3:22:52 PM By: Rodell PernaScott, Dajea Entered By: Rodell PernaScott, Dajea on 09/22/2018 14:25:18 Breidenbach, Delance A. ([id number]) -------------------------------------------------------------------------------- Clinic Level of Care Assessment Details Patient Name: Mendolia, Wallie A. Date of Service: 09/22/2018 2:15 PM Medical Record Number: [id number] Patient Account Number: [id number] Date of Birth/Sex: 1976/01/28 (43 y.o. M) Treating RN: Arnette NorrisBiell, Kristina Primary Care Kelsye Loomer: Bluford Mainejan-Sie, Sheikh Other Clinician: Referring Deshea Pooley: Gala LewandowskyEVANS, BRENT Treating Curley Fayette/Extender: Linwood DibblesSTONE III, HOYT Weeks in Treatment: 0 Clinic Level of Care  Assessment Items TOOL 2 Quantity Score []  - Use when only an EandM is performed on the INITIAL visit 0 ASSESSMENTS - Nursing Assessment / Reassessment X - General Physical Exam (combine w/ comprehensive assessment (listed just below) when 1 20 performed on new pt. evals) X- 1 25 Comprehensive Assessment (HX, ROS, Risk Assessments, Wounds Hx, etc.) ASSESSMENTS - Wound and Skin Assessment / Reassessment X - Simple Wound Assessment / Reassessment - one wound 1 5 []  - 0 Complex Wound Assessment / Reassessment - multiple wounds []  - 0 Dermatologic / Skin Assessment (not related to wound area) ASSESSMENTS - Ostomy and/or Continence Assessment and Care []  - Incontinence Assessment and Management 0 []  - 0 Ostomy Care Assessment and Management (repouching, etc.) PROCESS - Coordination of Care X - Simple Patient / Family Education for ongoing care 1 15 []  - 0 Complex (extensive) Patient / Family Education for ongoing care []  - 0 Staff obtains ChiropractorConsents, Records, Test Results / Process Orders []  - 0 Staff telephones HHA, Nursing Homes / Clarify orders / etc []  - 0 Routine Transfer to another Facility (non-emergent condition) []  - 0 Routine Hospital Admission (non-emergent condition) []  - 0 New Admissions / Manufacturing engineernsurance Authorizations / Ordering NPWT, Apligraf, etc. []  - 0 Emergency Hospital Admission (emergent condition) X- 1 10 Simple Discharge Coordination []  - 0 Complex (extensive) Discharge Coordination PROCESS - Special Needs []  - Pediatric / Minor Patient Management 0 []  - 0 Isolation Patient Management Robison, Rollin A. ([id number]) []  - 0 Hearing / Language / Visual special needs []  - 0 Assessment of Community assistance (transportation, D/C planning, etc.) []  - 0 Additional assistance / Altered mentation []  - 0 Support Surface(s) Assessment (bed, cushion, seat, etc.) INTERVENTIONS - Wound Cleansing / Measurement X - Wound Imaging (photographs - any number of wounds) 1  5 []  - 0 Wound Tracing (instead of photographs) X- 1 5 Simple Wound Measurement - one wound []  - 0 Complex Wound Measurement - multiple wounds X- 1 5 Simple Wound Cleansing - one wound []  - 0 Complex Wound Cleansing - multiple wounds INTERVENTIONS - Wound Dressings X -  Small Wound Dressing one or multiple wounds 1 10 []  - 0 Medium Wound Dressing one or multiple wounds []  - 0 Large Wound Dressing one or multiple wounds []  - 0 Application of Medications - injection INTERVENTIONS - Miscellaneous []  - External ear exam 0 []  - 0 Specimen Collection (cultures, biopsies, blood, body fluids, etc.) []  - 0 Specimen(s) / Culture(s) sent or taken to Lab for analysis []  - 0 Patient Transfer (multiple staff / Nurse, adultHoyer Lift / Similar devices) []  - 0 Simple Staple / Suture removal (25 or less) []  - 0 Complex Staple / Suture removal (26 or more) []  - 0 Hypo / Hyperglycemic Management (close monitor of Blood Glucose) X- 1 15 Ankle / Brachial Index (ABI) - do not check if billed separately Has the patient been seen at the hospital within the last three years: Yes Total Score: 115 Level Of Care: New/Established - Level 3 Electronic Signature(s) Signed: 09/22/2018 4:12:29 PM By: Arnette NorrisBiell, Kristina Entered By: Arnette NorrisBiell, Kristina on 09/22/2018 15:17:05 Punches, Kyran A. ([id number]) -------------------------------------------------------------------------------- Lower Extremity Assessment Details Patient Name: Wernert, Elisa A. Date of Service: 09/22/2018 2:15 PM Medical Record Number: [id number] Patient Account Number: [id number] Date of Birth/Sex: 1975/08/17 (43 y.o. M) Treating RN: Rodell PernaScott, Dajea Primary Care Donata Reddick: Bluford Mainejan-Sie, Sheikh Other Clinician: Referring Rayvion Stumph: Gala LewandowskyEVANS, BRENT Treating Lamesha Tibbits/Extender: Linwood DibblesSTONE III, HOYT Weeks in Treatment: 0 Edema Assessment Assessed: [Left: No] [Right: No] Edema: [Left: N] [Right: o] Calf Left: Right: Point of Measurement: 33 cm From Medial Instep  39 cm cm Ankle Left: Right: Point of Measurement: 12 cm From Medial Instep 25 cm cm Vascular Assessment Pulses: Dorsalis Pedis Palpable: [Left:No] Doppler Audible: [Left:Yes] Posterior Tibial Palpable: [Left:No] Doppler Audible: [Left:Yes] Blood Pressure: Brachial: [Left:142] Dorsalis Pedis: 132 Ankle: Posterior Tibial: 128 Ankle Brachial Index: [Left:0.93] Electronic Signature(s) Signed: 09/22/2018 3:22:52 PM By: Rodell PernaScott, Dajea Entered By: Rodell PernaScott, Dajea on 09/22/2018 14:44:36 Casstevens, Igor A. ([id number]) -------------------------------------------------------------------------------- Multi Wound Chart Details Patient Name: Goetsch, Refugio A. Date of Service: 09/22/2018 2:15 PM Medical Record Number: [id number] Patient Account Number: [id number] Date of Birth/Sex: 1975/08/17 (43 y.o. M) Treating RN: Arnette NorrisBiell, Kristina Primary Care Mahonri Seiden: Bluford Mainejan-Sie, Sheikh Other Clinician: Referring Levonne Carreras: Gala LewandowskyEVANS, BRENT Treating Gustabo Gordillo/Extender: Linwood DibblesSTONE III, HOYT Weeks in Treatment: 0 Vital Signs Height(in): 69 Pulse(bpm): 98 Weight(lbs): 230 Blood Pressure(mmHg): 130/73 Body Mass Index(BMI): 34 Temperature(F): 98.3 Respiratory Rate 16 (breaths/min): Photos: [N/A:N/A] Wound Location: Foot - Plantar N/A N/A Wounding Event: Blister N/A N/A Primary Etiology: Diabetic Wound/Ulcer of the N/A N/A Lower Extremity Comorbid History: Hypertension, Type II Diabetes N/A N/A Date Acquired: 05/28/2018 N/A N/A Weeks of Treatment: 0 N/A N/A Wound Status: Open N/A N/A Measurements L x W x D 2.4x1.8x0.7 N/A N/A (cm) Area (cm) : 3.393 N/A N/A Volume (cm) : 2.375 N/A N/A Classification: Grade 2 N/A N/A Exudate Amount: Medium N/A N/A Exudate Type: Serous N/A N/A Exudate Color: amber N/A N/A Wound Margin: Well defined, not attached N/A N/A Granulation Amount: Medium (34-66%) N/A N/A Granulation Quality: Red, Hyper-granulation N/A N/A Necrotic Amount: Small (1-33%) N/A N/A Exposed  Structures: Fat Layer (Subcutaneous N/A N/A Tissue) Exposed: Yes Fascia: No Tendon: No Muscle: No Joint: No Bone: No Epithelialization: None N/A N/A Stickley, Woodward A. ([id number]) Treatment Notes Electronic Signature(s) Signed: 09/22/2018 4:12:29 PM By: Arnette NorrisBiell, Kristina Entered By: Arnette NorrisBiell, Kristina on 09/22/2018 14:56:32 Bargo, Jaisean AMarland Kitchen. ([id number]) -------------------------------------------------------------------------------- Multi-Disciplinary Care Plan Details Patient Name: Chronis, Keyler A. Date of Service: 09/22/2018 2:15 PM Medical Record Number: [id number] Patient Account Number: [id number] Date of Birth/Sex: 1975/08/17 (43 y.o. M) Treating RN:  Arnette NorrisBiell, Kristina Primary Care Gerrad Welker: Bluford Mainejan-Sie, Sheikh Other Clinician: Referring Taren Dymek: Gala LewandowskyEVANS, BRENT Treating Tnya Ades/Extender: Linwood DibblesSTONE III, HOYT Weeks in Treatment: 0 Active Inactive Pressure Nursing Diagnoses: Knowledge deficit related to causes and risk factors for pressure ulcer development Knowledge deficit related to management of pressures ulcers Goals: Patient/caregiver will verbalize risk factors for pressure ulcer development Date Initiated: 09/22/2018 Target Resolution Date: 10/23/2018 Goal Status: Active Interventions: Assess: immobility, friction, shearing, incontinence upon admission and as needed Assess offloading mechanisms upon admission and as needed Assess potential for pressure ulcer upon admission and as needed Provide education on pressure ulcers Notes: Wound/Skin Impairment Nursing Diagnoses: Impaired tissue integrity Goals: Ulcer/skin breakdown will have a volume reduction of 30% by week 4 Date Initiated: 09/22/2018 Target Resolution Date: 10/23/2018 Goal Status: Active Interventions: Assess patient/caregiver ability to obtain necessary supplies Assess patient/caregiver ability to perform ulcer/skin care regimen upon admission and as needed Assess ulceration(s) every visit Provide education  on ulcer and skin care Notes: Electronic Signature(s) Signed: 09/22/2018 4:12:29 PM By: Arnette NorrisBiell, Kristina Entered By: Arnette NorrisBiell, Kristina on 09/22/2018 14:54:51 Zuleta, Gilmore A. ([id number]) Spare, Sparrow A. ([id number]) -------------------------------------------------------------------------------- Pain Assessment Details Patient Name: Pilkington, Vaden A. Date of Service: 09/22/2018 2:15 PM Medical Record Number: [id number] Patient Account Number: [id number] Date of Birth/Sex: April 16, 1975 (43 y.o. M) Treating RN: Rodell PernaScott, Dajea Primary Care Keldrick Pomplun: Bluford Mainejan-Sie, Sheikh Other Clinician: Referring Mellanie Bejarano: Gala LewandowskyEVANS, BRENT Treating Evangelia Whitaker/Extender: Linwood DibblesSTONE III, HOYT Weeks in Treatment: 0 Active Problems Location of Pain Severity and Description of Pain Patient Has Paino Yes Site Locations Pain Location: Generalized Pain, Pain in Ulcers Rate the pain. Current Pain Level: 9 Pain Management and Medication Current Pain Management: Electronic Signature(s) Signed: 09/22/2018 3:22:52 PM By: Rodell PernaScott, Dajea Entered By: Rodell PernaScott, Dajea on 09/22/2018 14:25:30 Albornoz, Khing A. ([id number]) -------------------------------------------------------------------------------- Patient/Caregiver Education Details Patient Name: Hanaway, Tarron A. Date of Service: 09/22/2018 2:15 PM Medical Record Number: [id number] Patient Account Number: [id number] Date of Birth/Gender: April 16, 1975 (43 y.o. M) Treating RN: Arnette NorrisBiell, Kristina Primary Care Physician: Bluford Mainejan-Sie, Sheikh Other Clinician: Referring Physician: Gala LewandowskyEVANS, BRENT Treating Physician/Extender: Skeet SimmerSTONE III, HOYT Weeks in Treatment: 0 Education Assessment Education Provided To: Patient Education Topics Provided Wound/Skin Impairment: Handouts: Caring for Your Ulcer Methods: Demonstration, Explain/Verbal Responses: State content correctly Electronic Signature(s) Signed: 09/22/2018 4:12:29 PM By: Arnette NorrisBiell, Kristina Entered By: Arnette NorrisBiell, Kristina on 09/22/2018  15:17:19 Mongiello, Dorman A. ([id number]) -------------------------------------------------------------------------------- Wound Assessment Details Patient Name: Cauthon, Yves A. Date of Service: 09/22/2018 2:15 PM Medical Record Number: [id number] Patient Account Number: [id number] Date of Birth/Sex: April 16, 1975 (43 y.o. M) Treating RN: Arnette NorrisBiell, Kristina Primary Care Parveen Freehling: Bluford Mainejan-Sie, Sheikh Other Clinician: Referring Rojean Ige: Gala LewandowskyEVANS, BRENT Treating Marlisa Caridi/Extender: Linwood DibblesSTONE III, HOYT Weeks in Treatment: 0 Wound Status Wound Number: 1 Primary Etiology: Diabetic Wound/Ulcer of the Lower Extremity Wound Location: Foot - Plantar Wound Status: Open Wounding Event: Blister Comorbid Hypertension, Type II Diabetes Date Acquired: 05/28/2018 History: Weeks Of Treatment: 0 Clustered Wound: No Photos Wound Measurements Length: (cm) 2.4 % Reduction Width: (cm) 1.8 % Reduction Depth: (cm) 1.9 Epitheliali Area: (cm) 3.393 Tunneling: Volume: (cm) 6.447 Underminin in Area: 0% in Volume: 0% zation: None No g: No Wound Description Classification: Grade 2 Foul Odor A Wound Margin: Well defined, not attached Slough/Fibr Exudate Amount: Medium Exudate Type: Serous Exudate Color: amber fter Cleansing: No ino Yes Wound Bed Granulation Amount: Medium (34-66%) Exposed Structure Granulation Quality: Red, Hyper-granulation Fascia Exposed: Yes Necrotic Amount: Small (1-33%) Fat Layer (Subcutaneous Tissue) Exposed: Yes Necrotic Quality: Adherent Slough Tendon Exposed: Yes Muscle Exposed: Yes Necrosis of Muscle: Yes Joint Exposed: No  Bone Exposed: Yes Botting, Zachory A. ([id number]) Electronic Signature(s) Signed: 09/22/2018 4:12:29 PM By: Harold Barban Entered By: Harold Barban on 09/22/2018 14:59:32 Zaro, Micky A. ([id number]) -------------------------------------------------------------------------------- Vitals Details Patient Name: Brucker, Criston A. Date of Service:  09/22/2018 2:15 PM Medical Record Number: [id number] Patient Account Number: [id number] Date of Birth/Sex: 1976-03-18 (43 y.o. M) Treating RN: Army Melia Primary Care Onalee Steinbach: Pernell Dupre Other Clinician: Referring James Senn: Daylene Katayama Treating Koltan Portocarrero/Extender: Melburn Hake, HOYT Weeks in Treatment: 0 Vital Signs Time Taken: 14:25 Temperature (F): 98.3 Height (in): 69 Pulse (bpm): 98 Source: Stated Respiratory Rate (breaths/min): 16 Weight (lbs): 230 Blood Pressure (mmHg): 130/73 Source: Stated Reference Range: 80 - 120 mg / dl Body Mass Index (BMI): 34 Electronic Signature(s) Signed: 09/22/2018 3:22:52 PM By: Army Melia Entered By: Army Melia on 09/22/2018 14:25:57

## 2018-09-24 NOTE — Progress Notes (Signed)
[name], [name] ([id number]) Visit Report for 09/22/2018 Chief Complaint Document Details Patient Name: [name], [name] Date of Service: 09/22/2018 2:15 PM Medical Record Number: [id number] Patient Account Number: [id number] Date of Birth/Sex: 07/18/1975 (43 y.o. M) Treating RN: Arnette NorrisBiell, Kristina Primary Care Provider: Bluford Mainejan-Sie, Sheikh Other Clinician: Referring Provider: Gala LewandowskyEVANS, BRENT Treating Provider/Extender: Linwood DibblesSTONE III, HOYT Weeks in Treatment: 0 Information Obtained from: Patient Chief Complaint Left plantar foot ulcer Electronic Signature(s) Signed: 09/24/2018 11:25:56 PM By: Lenda KelpStone III, Hoyt PA-C Entered By: Lenda KelpStone III, Hoyt on 09/22/2018 14:51:27 Ohlinger, [name] ([id number]) -------------------------------------------------------------------------------- HPI Details Patient Name: Tyrone MangoICHMOND, [name] Date of Service: 09/22/2018 2:15 PM Medical Record Number: [id number] Patient Account Number: [id number] Date of Birth/Sex: 07/18/1975 (43 y.o. M) Treating RN: Arnette NorrisBiell, Kristina Primary Care Provider: Bluford Mainejan-Sie, Sheikh Other Clinician: Referring Provider: Gala LewandowskyEVANS, BRENT Treating Provider/Extender: Linwood DibblesSTONE III, HOYT Weeks in Treatment: 0 History of Present Illness HPI Description: 09/22/18 on evaluation today patient presents for initial evaluation here in our clinic regarding issues that he has been having with his left plantar foot at the base of the second toe. He tells me that this has been present since January 2020. Subsequently she was seen by podiatry where he did have an x-ray of Aug 18, 2018. A culture was also obtained. The culture showed that he had group B strep noted but no other abnormal findings. He's been on doxycycline since that time. He is still doxycycline currently in fact is picking up another prescription today. Fortunately there's no signs of systemic infection. The patient does have an extensive history with his left lower extremity of having had what sounds to  be compartment syndrome it subsequently a bypass to restore flow into the left lower extremity. Last time you seen vascular as best I can tell was in 2017. This in fact that was on 05/27/15. At that point he had no evidence of hemodynamically significant inflow extraction. Overall things seem to be doing well which is excellent news. He hasn't however seen anyone since that time that has been about three years therefore I think it may be beneficial for him to go back to see them. Especially in light of having the ulceration which again if he doesn't have sufficient flow he likely is not going to heal appropriately. His ABI today was 0.93 and seems to be okay hopefully this is an indication that things are doing well. Secondarily I do think is gonna require an MRI of the left foot I'm concerned about the fact that he has bone involvement right now and likely may have a bone infection. He is on the doxycycline which is good news but again the wound has not been improving according to what he tells me. 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 Electronic Signature(s) Signed: 09/24/2018 11:25:56 PM By: Lenda KelpStone III, Hoyt PA-C Entered By: Lenda KelpStone III, Hoyt on 09/22/2018 16:14:37 [name], [name] ([id number]) -------------------------------------------------------------------------------- Physical Exam Details Patient Name: [name], [name] Date of Service: 09/22/2018 2:15 PM Medical Record Number: [id number] Patient Account Number: [id number] Date of Birth/Sex: 07/18/1975 (43 y.o. M) Treating RN: Arnette NorrisBiell, Kristina Primary Care Provider: Bluford Mainejan-Sie, Sheikh Other Clinician: Referring Provider: Gala LewandowskyEVANS, BRENT Treating Provider/Extender: Linwood DibblesSTONE III, HOYT Weeks in Treatment: 0 Constitutional sitting or standing blood pressure is within target range for patient.. pulse regular and within target range for patient.Marland Kitchen. respirations regular, non-labored and within target range for patient.Marland Kitchen. temperature within target  range for patient.. Well- nourished and well-hydrated in no acute distress. Eyes conjunctiva clear no eyelid edema noted.  pupils equal round and reactive to light and accommodation. Ears, Nose, Mouth, and Throat no gross abnormality of ear auricles or external auditory canals. normal hearing noted during conversation. mucus membranes moist. Respiratory normal breathing without difficulty. clear to auscultation bilaterally. Cardiovascular regular rate and rhythm with normal S1, S2. 2+ dorsalis pedis/posterior tibialis pulses. no clubbing, cyanosis, significant edema, <3 sec cap refill. Gastrointestinal (GI) soft, non-tender, non-distended, +BS. no ventral hernia noted. Musculoskeletal normal gait and posture. 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 Psychiatric this patient is able to make decisions and demonstrates good insight into disease process. Alert and Oriented x 3. pleasant and cooperative. Notes Patient's wound bed currently showed signs of okay granulation he did have several areas of undermining noted in around the wound bed although the wound itself that extend all the way down to bone which I was able to probe. No sharp debridement was performed today. He does continue to have drainage unfortunately at this point. No fevers, chills, nausea, or vomiting noted at this time. He has been using what appears to be a walking boot to try to help keep pressure off. Again I did not want to perform these debridement today until I confirmed that he has sufficient flow to support this. Electronic Signature(s) Signed: 09/24/2018 11:25:56 PM By: Worthy Keeler PA-C Entered By: Worthy Keeler on 09/22/2018 16:17:37 Goldsmith, [name] ([id number]) -------------------------------------------------------------------------------- Physician Orders Details Patient Name: [name], [name] Date of Service: 09/22/2018  2:15 PM Medical Record Number: [id number] Patient Account Number: [id number] Date of Birth/Sex: December 31, 1975 (43 y.o. 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 Follow-up Appointments Wound #1 Plantar Foot o Other: - Return Wednesday July 1st Off-Loading Wound #1 Plantar Foot o Other: - Cam walker boot Consults o Vascular - [facility] with ABI and TBI, consult included. Radiology o MRI, lower extremity with contast - with and without contrast Mote, Lief A. ([id number]) Electronic Signature(s) Signed: 09/22/2018 4:12:29 PM By: Harold Barban Signed: 09/24/2018 11:25:56 PM By: Worthy Keeler PA-C Entered By: Harold Barban on 09/22/2018 15:16:00 [name], [name] ([id number]) -------------------------------------------------------------------------------- Problem List Details Patient Name: Yepiz, Ysabel A. Date of Service: 09/22/2018 2:15 PM Medical Record Number: [id number] Patient Account Number: [id number] Date of Birth/Sex: April 23, 1975 (43 y.o. M) Treating RN: Harold Barban Primary Care Provider: Pernell Dupre Other Clinician: Referring Provider: Daylene Katayama Treating Provider/Extender: Melburn Hake, HOYT Weeks in Treatment: 0 Active Problems ICD-10 Evaluated Encounter Code Description Active Date Today Diagnosis E11.621 Type 2 diabetes mellitus with foot ulcer 09/22/2018 No Yes L97.522 Non-pressure chronic ulcer of other part of left foot with fat 09/22/2018 No Yes layer exposed I70.312 Atherosclerosis of unspecified type of  bypass graft(s) of the 09/22/2018 No Yes extremities with intermittent claudication, left leg E11.40 Type 2 diabetes mellitus with diabetic neuropathy, 09/22/2018 No Yes unspecified Inactive Problems Resolved Problems Electronic Signature(s) Signed: 09/24/2018 11:25:56 PM By: Lenda KelpStone III, Hoyt PA-C Entered By: Lenda KelpStone III, Hoyt on 09/22/2018 14:51:16 [name], [name] ([id number]) -------------------------------------------------------------------------------- Progress Note Details Patient Name: [name], [name] Date of Service: 09/22/2018 2:15 PM Medical Record Number: [id number] Patient Account Number: [id number] Date of Birth/Sex: 09-May-1975 (43 y.o. M) Treating RN: Arnette NorrisBiell, Kristina Primary Care Provider: Bluford Mainejan-Sie, Sheikh Other Clinician: Referring Provider: Gala LewandowskyEVANS, BRENT Treating Provider/Extender: Linwood DibblesSTONE III, HOYT Weeks in Treatment: 0 Subjective Chief Complaint Information obtained from Patient Left plantar foot ulcer History of Present Illness (HPI) 09/22/18 on evaluation today patient presents for initial evaluation here in our clinic regarding issues that he has been having with his left plantar foot at the base of the second toe. He tells me that this has been present since January 2020. Subsequently she was seen by podiatry where he did have an x-ray of Aug 18, 2018. A culture was also obtained. The culture showed that he had group B strep noted but no other abnormal findings. He's been on doxycycline since that  time. He is still doxycycline currently in fact is picking up another prescription today. Fortunately there's no signs of systemic infection. 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 Last time you seen vascular as best I can tell was in 2017. This in fact that was on 05/27/15. At that point he had no evidence of hemodynamically significant inflow extraction. Overall things seem to be doing well which is excellent news. He hasn't however seen anyone since that time that has been about three years therefore I think it may be beneficial for him to go back to see them. Especially in light of having the ulceration which again if he doesn't have sufficient flow he likely is not going to heal appropriately. His ABI today was 0.93 and seems to be okay hopefully this is an indication that things are doing well. Secondarily I do think is gonna require an MRI of the left foot I'm concerned about the fact that he has bone involvement right now and likely may have a bone infection. He is on the doxycycline which is good news but again the wound has not been improving according to what he tells me. 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 Patient History Information obtained from Patient. Allergies no known allergies Family History Cancer - Father, Diabetes - Father,Mother, Hypertension - Father, Lung Disease - Father, Stroke - Father, Thyroid Problems - Mother, No family history of Heart Disease, Hereditary Spherocytosis, Kidney Disease, Seizures, Tuberculosis. Social History Current every day smoker - 1/2 pack day, Marital Status - Single, Alcohol Use - Rarely, Drug Use - No History, Caffeine Use - Daily - soda. Medical History Eyes Denies history of Cataracts, Glaucoma, Optic Neuritis Ear/Nose/Mouth/Throat Denies history of Chronic sinus  problems/congestion, Middle ear problems Hematologic/Lymphatic Denies history of Anemia, Hemophilia, Human Immunodeficiency Virus, Lymphedema, Sickle Cell Disease Respiratory Worthington, Naftuli A. ([id number]) Denies history of Aspiration, Asthma, Chronic Obstructive Pulmonary Disease (COPD), Pneumothorax, Sleep Apnea, Tuberculosis Cardiovascular Patient has history of Hypertension Denies history of Angina, Arrhythmia, Congestive Heart Failure, Coronary Artery Disease, Deep Vein Thrombosis, Hypotension, Myocardial Infarction, Peripheral Arterial Disease, Peripheral Venous Disease, Phlebitis, Vasculitis Gastrointestinal Denies history  of Cirrhosis , Colitis, Crohn s, Hepatitis A, Hepatitis B, Hepatitis C Endocrine Patient has history of Type II Diabetes Genitourinary Denies history of End Stage Renal Disease Immunological Denies history of Lupus Erythematosus, Raynaud s, Scleroderma Integumentary (Skin) Denies history of History of Burn, History of pressure wounds Musculoskeletal Denies history of Gout, Rheumatoid Arthritis, Osteoarthritis, Osteomyelitis Neurologic Denies history of Dementia, Neuropathy, Quadriplegia, Paraplegia, Seizure Disorder Oncologic Denies history of Received Chemotherapy, Received Radiation Psychiatric Denies history of Anorexia/bulimia, Confinement Anxiety Patient is treated with Insulin, Oral Agents. Review of Systems (ROS) Constitutional Symptoms (General Health) Denies complaints or symptoms of Fatigue, Fever, Chills, Marked Weight Change. Eyes Denies complaints or symptoms of Dry Eyes, Vision Changes, Glasses / Contacts. Ear/Nose/Mouth/Throat Denies complaints or symptoms of Difficult clearing ears, Sinusitis. Hematologic/Lymphatic Denies complaints or symptoms of Bleeding / Clotting Disorders, Human Immunodeficiency Virus. Respiratory Denies complaints or symptoms of Chronic or frequent coughs, Shortness of Breath. Cardiovascular Denies complaints  or symptoms of Chest pain, LE edema. Gastrointestinal Denies complaints or symptoms of Frequent diarrhea, Nausea, Vomiting. Endocrine Denies complaints or symptoms of Hepatitis, Thyroid disease, Polydypsia (Excessive Thirst). Genitourinary Denies complaints or symptoms of Kidney failure/ Dialysis, Incontinence/dribbling. Immunological Denies complaints or symptoms of Hives, Itching. Integumentary (Skin) Complains or has symptoms of Wounds - diabetic foot ulcer. Denies complaints or symptoms of Bleeding or bruising tendency, Breakdown, Swelling. Musculoskeletal Denies complaints or symptoms of Muscle Pain, Muscle Weakness. Neurologic Denies complaints or symptoms of Numbness/parasthesias, Focal/Weakness. Psychiatric Denies complaints or symptoms of Anxiety, Claustrophobia. [name], [name] ([id number]) Objective Constitutional sitting or standing blood pressure is within target range for patient.. pulse regular and within target range for patient.Marland Kitchen respirations regular, non-labored and within target range for patient.Marland Kitchen temperature within target range for patient.. Well- nourished and well-hydrated in no acute distress. Vitals Time Taken: 2:25 PM, Height: 69 in, Source: Stated, Weight: 230 lbs, Source: Stated, BMI: 34, Temperature: 98.3 F, Pulse: 98 bpm, Respiratory Rate: 16 breaths/min, Blood Pressure: 130/73 mmHg. Eyes conjunctiva clear no eyelid edema noted. pupils equal round and reactive to light and accommodation. Ears, Nose, Mouth, and Throat no gross abnormality of ear auricles or external auditory canals. normal hearing noted during conversation. mucus membranes moist. Respiratory normal breathing without difficulty. clear to auscultation bilaterally. Cardiovascular regular rate and rhythm with normal S1, S2. 2+ dorsalis pedis/posterior tibialis pulses. no clubbing, cyanosis, significant edema, Gastrointestinal (GI) soft, non-tender, non-distended, +BS. no ventral  hernia noted. Musculoskeletal normal gait and posture. 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 Psychiatric this patient is able to make decisions and demonstrates good insight into disease process. Alert and Oriented x 3. pleasant and cooperative. General Notes: Patient's wound bed currently showed signs of okay granulation he did have several areas of undermining noted in around the wound bed although the wound itself that extend all the way down to bone which I was able to probe. No sharp debridement was performed today. He does continue to have drainage unfortunately at this point. No fevers, chills, nausea, or vomiting noted at this time. He has been using what appears to be a walking boot to try to help keep pressure off. Again I did not want to perform these debridement today until I confirmed that he has sufficient flow to support this. Integumentary (Hair, Skin) Wound #1 status is Open. Original cause of wound was Blister. The wound is located on the Plantar Foot. The wound measures 2.4cm length x 1.8cm width x  1.9cm depth; 3.393cm^2 area and 6.447cm^3 volume. There is bone, muscle, tendon, Fat Layer (Subcutaneous Tissue) Exposed, and fascia exposed. There is no tunneling or undermining noted. There is a medium amount of serous drainage noted. The wound margin is well defined and not attached to the wound base. There is medium (34-66%) red, hyper - granulation within the wound bed. There is a small (1-33%) amount of necrotic tissue within the wound bed including Adherent Slough and Necrosis of Muscle. Rachels, Haidan A. 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 Follow-up Appointments: Wound #1 Plantar Foot: Other: - Return Wednesday July 1st Off-Loading: Wound #1 Plantar Foot: Other: - Cam walker boot Consults ordered were: Vascular - [facility] with ABI and TBI, consult included. Radiology ordered were: MRI, lower extremity with contast - with and without contrast My suggestion at this point is gonna be that we go ahead and initiate the above wound care measures for the next week and the patient is in agreement with that plan. Subsequently I'm gonna order an MRI of the left foot to evaluate for osteomyelitis I think this is very probable in his case. Subsequently he will continue with the doxycycline which is been on since May 22 at this time. I'm also gonna put in a referral to [facility] vascular for arterial studies with ABI and TBI as well as consult to ensure that his blood flow is appropriate based on his history of trauma to the slough lower extremity with bypass. The patient is in agreement with all the above. We will subsequently see were things stand at follow-up. Please see above for specific wound care orders. We will see patient for re-evaluation in 1 week(s) here in the clinic. If anything worsens or changes patient will contact our office for additional recommendations. Electronic Signature(s) [name], [name] ([id number]) Signed: 09/24/2018 11:25:56 PM By: Lenda Kelp PA-C Entered By: Lenda Kelp on 09/22/2018 16:18:27 [name], [name]  ([id number]) -------------------------------------------------------------------------------- ROS/PFSH Details Patient Name: Tyrone Mango A. Date of Service: 09/22/2018 2:15 PM Medical Record Number: [id number] Patient Account Number: [id number] Date of Birth/Sex: [birth year] (43 y.o. M) Treating RN: Rodell Perna Primary Care Provider: Bluford Main Other Clinician: Referring Provider: Gala Lewandowsky Treating Provider/Extender: Linwood Dibbles, HOYT Weeks in Treatment: 0 Information Obtained From Patient Constitutional Symptoms (General Health) Complaints and Symptoms: Negative for: Fatigue; Fever; Chills; Marked Weight Change Eyes Complaints and Symptoms: Negative for: Dry Eyes; Vision Changes; Glasses / Contacts Medical History: Negative for: Cataracts; Glaucoma; Optic Neuritis Ear/Nose/Mouth/Throat Complaints and Symptoms: Negative for: Difficult clearing ears; Sinusitis Medical History: Negative for: Chronic sinus problems/congestion; Middle ear problems Hematologic/Lymphatic Complaints and Symptoms: Negative for: Bleeding / Clotting Disorders; Human Immunodeficiency Virus Medical History: Negative for: Anemia; Hemophilia; Human Immunodeficiency Virus; Lymphedema; Sickle Cell Disease Respiratory Complaints and Symptoms: Negative for: Chronic or frequent coughs; Shortness of Breath Medical History: Negative for: Aspiration; Asthma; Chronic Obstructive Pulmonary Disease (COPD); Pneumothorax; Sleep Apnea; Tuberculosis Cardiovascular Complaints and Symptoms: Negative for: Chest  pain; LE edema Medical History: Positive for: Hypertension Negative for: Angina; Arrhythmia; Congestive Heart Failure; Coronary Artery Disease; Deep Vein Thrombosis; Hypotension; Myocardial Infarction; Peripheral Arterial Disease; Peripheral Venous Disease; Phlebitis; Vasculitis [name], [name] ([id number]) Gastrointestinal Complaints and Symptoms: Negative for: Frequent diarrhea; Nausea;  Vomiting Medical History: Negative for: Cirrhosis ; Colitis; Crohnos; Hepatitis A; Hepatitis B; Hepatitis C Endocrine Complaints and Symptoms: Negative for: Hepatitis; Thyroid disease; Polydypsia (Excessive Thirst) Medical History: Positive for: Type II Diabetes Time with diabetes: 2 years Treated with: Insulin, Oral agents Genitourinary Complaints and Symptoms: Negative for: Kidney failure/ Dialysis; Incontinence/dribbling Medical History: Negative for: End Stage Renal Disease Immunological Complaints and Symptoms: Negative for: Hives; Itching Medical History: Negative for: Lupus Erythematosus; Raynaudos; Scleroderma Integumentary (Skin) Complaints and Symptoms: Positive for: Wounds - diabetic foot ulcer Negative for: Bleeding or bruising tendency; Breakdown; Swelling Medical History: Negative for: History of Burn; History of pressure wounds Musculoskeletal Complaints and Symptoms: Negative for: Muscle Pain; Muscle Weakness Medical History: Negative for: Gout; Rheumatoid Arthritis; Osteoarthritis; Osteomyelitis Neurologic Complaints and Symptoms: Negative for: Numbness/parasthesias; Focal/Weakness Medical History: Negative for: Dementia; Neuropathy; Quadriplegia; Paraplegia; Seizure Disorder [name], [name] ([id number]) Psychiatric Complaints and Symptoms: Negative for: Anxiety; Claustrophobia Medical History: Negative for: Anorexia/bulimia; Confinement Anxiety Oncologic Medical History: Negative for: Received Chemotherapy; Received Radiation Immunizations Pneumococcal Vaccine: Received Pneumococcal Vaccination: No Implantable Devices None Family and Social History Cancer: Yes - Father; Diabetes: Yes - Father,Mother; Heart Disease: No; Hereditary Spherocytosis: No; Hypertension: Yes - Father; Kidney Disease: No; Lung Disease: Yes - Father; Seizures: No; Stroke: Yes - Father; Thyroid Problems: Yes - Mother; Tuberculosis: No; Current every day smoker - 1/2 pack  day; Marital Status - Single; Alcohol Use: Rarely; Drug Use: No History; Caffeine Use: Daily - soda; Financial Concerns: No; Food, Clothing or Shelter Needs: No; Support System Lacking: No; Transportation Concerns: No Electronic Signature(s) Signed: 09/22/2018 3:22:52 PM By: Rodell PernaScott, Dajea Signed: 09/24/2018 11:25:56 PM By: Lenda KelpStone III, Hoyt PA-C Entered By: Rodell PernaScott, Dajea on 09/22/2018 14:32:18 [name], Tymeir A. ([id number]) -------------------------------------------------------------------------------- SuperBill Details Patient Name: Pontiff, Turhan A. Date of Service: 09/22/2018 Medical Record Number: [id number] Patient Account Number: [id number] Date of Birth/Sex: [birth year] (43 y.o. 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, left I70.312 leg E11.40 Type 2 diabetes mellitus with diabetic neuropathy, unspecified Facility Procedures CPT4 Code: [id number] Description: 99213 - WOUND CARE VISIT-LEV 3 EST PT Modifier: Quantity: 1 Physician Procedures CPT4: Description Modifier Quantity Code [id number] 99204 - WC PHYS LEVEL 4 - NEW PT 1 ICD-10 Diagnosis Description E11.621 Type 2 diabetes mellitus with foot ulcer L97.522 Non-pressure chronic ulcer of other part of left foot with fat layer exposed I70.312  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

## 2018-09-27 ENCOUNTER — Other Ambulatory Visit: Payer: Self-pay

## 2018-09-27 ENCOUNTER — Encounter: Payer: Medicaid Other | Attending: Internal Medicine | Admitting: Internal Medicine

## 2018-09-27 DIAGNOSIS — I70312 Atherosclerosis of unspecified type of bypass graft(s) of the extremities with intermittent claudication, left leg: Secondary | ICD-10-CM | POA: Insufficient documentation

## 2018-09-27 DIAGNOSIS — E11621 Type 2 diabetes mellitus with foot ulcer: Secondary | ICD-10-CM | POA: Diagnosis not present

## 2018-09-27 DIAGNOSIS — I10 Essential (primary) hypertension: Secondary | ICD-10-CM | POA: Insufficient documentation

## 2018-09-27 DIAGNOSIS — E1140 Type 2 diabetes mellitus with diabetic neuropathy, unspecified: Secondary | ICD-10-CM | POA: Diagnosis not present

## 2018-09-27 DIAGNOSIS — M21372 Foot drop, left foot: Secondary | ICD-10-CM | POA: Diagnosis not present

## 2018-09-27 DIAGNOSIS — L97522 Non-pressure chronic ulcer of other part of left foot with fat layer exposed: Secondary | ICD-10-CM | POA: Diagnosis not present

## 2018-09-27 DIAGNOSIS — L84 Corns and callosities: Secondary | ICD-10-CM | POA: Insufficient documentation

## 2018-09-27 DIAGNOSIS — F17210 Nicotine dependence, cigarettes, uncomplicated: Secondary | ICD-10-CM | POA: Diagnosis not present

## 2018-10-03 NOTE — Progress Notes (Signed)
MOTTY, BORIN ([id number]) Visit Report for 09/27/2018 HPI Details Patient Name: [name], [name] Date of Service: 09/27/2018 3:15 PM Medical Record Number: [id number] Patient Account Number: [id number] Date of Birth/Sex: 06-05-1975 (43 y.o. M) Treating RN: [name] Primary Care Provider: Pernell [name] Other Clinician: Referring Provider: Pernell [name] Treating Provider/Extender: [name] in Treatment: 0 History of Present Illness HPI Description: 09/22/18 on evaluation today patient presents for initial evaluation here in our clinic regarding issues that he has been having with his left plantar foot at the base of the second toe. He tells me that this has been present since January 2020. Subsequently she was seen by podiatry where he did have an x-ray of Aug 18, 2018. A culture was also obtained. The culture showed that he had group B strep noted but no other abnormal findings. He's been on doxycycline since that time. He is still doxycycline currently in fact is picking up another prescription today. Fortunately there's no signs of systemic infection. 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 Last time you seen vascular as best I can tell was in 2017. This in fact that was on 05/27/15. At that point he had no evidence of hemodynamically significant inflow extraction. Overall things seem to be doing well which is excellent news. He hasn't however seen anyone since that time that has been about three years therefore I think it may be beneficial for him to go back to see them. Especially in light of having the ulceration which again if he doesn't have sufficient flow he likely is not going to heal appropriately. His ABI today was 0.93 and seems to be okay hopefully this is an indication that things are doing well.  Secondarily I do think is gonna require an MRI of the left foot I'm concerned about the fact that he has bone involvement right now and likely may have a bone infection. He is on the doxycycline which is good news but again the wound has not been improving according to what he tells me. 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 7/1; patient admitted to our clinic last week. We still have not managed to arrange an MRI as it is in the [facility] approval phase. Our staff called the Medicaid office to press for an urgent processing. He is using silver alginate. He has a Publishing rights manager) Signed: 09/27/2018 6:06:19 PM By: Tyrone Ham MD Entered By: [name] on 09/27/2018 17:45:38 [name], [name] ([id number]) -------------------------------------------------------------------------------- Physical Exam Details Patient Name: [name], [name] Date of Service: 09/27/2018 3:15 PM Medical Record Number: [id number] Patient Account Number: [id number] Date of Birth/Sex: [birth year] (43 y.o. M) Treating RN: [name] Primary Care Provider: Pernell [name] Other Clinician: Referring Provider: Pernell [name] Treating Provider/Extender: [name] in Treatment: 0 Constitutional Patient is hypertensive.. Pulse regular and within target range for patient.Marland Kitchen Respirations regular, non-labored and within target range.. Temperature is normal and within the target range for the patient.Marland Kitchen appears in no distress. Eyes Conjunctivae clear. No discharge. Respiratory Respiratory effort is easy and symmetric bilaterally. Rate is normal at rest and on room air.. Cardiovascular Pedal pulses palpable and strong bilaterally.. Lymphatic None palpable in the left popliteal area. Integumentary (Hair, Skin) No primary cutaneous issue is seen. Neurological Insensate. Notes Wound exam; the wound bed has decent granulation however  this easily probes to bone which I  think is the second metatarsal head. The wound itself appears to be between the second and third met heads on the left foot. There is discoloration of the skin around the wound spreading into the base of the toes also an area on the lateral part of the foot. There is no purulent drainage. He is reasonably insensate. Electronic Signature(s) Signed: 09/27/2018 6:06:19 PM By: Tyrone Ham MD Entered By: [name] on 09/27/2018 17:47:44 [name], Tyrone [name] ([id number]) -------------------------------------------------------------------------------- Physician Orders Details Patient Name: [name], [name] Date of Service: 09/27/2018 3:15 PM Medical Record Number: [id number] Patient Account Number: [id number] Date of Birth/Sex: 07/09/1975 (43 y.o. M) Treating RN: [name] Primary Care Provider: Pernell [name] Other Clinician: Referring Provider: Pernell [name] Treating Provider/Extender: [name] in Treatment: 0 Verbal / Phone Orders: No Diagnosis Coding Wound Cleansing Wound #1 Left,Plantar Foot o Cleanse wound with mild soap and water - Dial Anti bacterial soap Anesthetic (add to Medication List) Wound #1 Left,Plantar Foot o Topical Lidocaine 4% cream applied to wound bed prior to debridement (In Clinic Only). Primary Wound Dressing Wound #1 Left,Plantar Foot o Silver Alginate Secondary Dressing Wound #1 Left,Plantar Foot o Dry Gauze o Conform/Kerlix o Coban Dressing Change Frequency Wound #1 Left,Plantar Foot o Change dressing every other day. Follow-up Appointments Wound #1 Left,Plantar Foot o Return Appointment in 1 week. Off-Loading Wound #1 Left,Plantar Foot o Other: - Cam walker boot Electronic Signature(s) Signed: 09/27/2018 6:06:19 PM By: Tyrone Ham MD Signed: 10/02/2018 5:43:16 PM By: [name], BSN, RN, CWS, Kim RN, BSN Entered By: [name], BSN, RN, CWS, [name] on 09/27/2018 15:54:29 [name], Tyrone [name]  ([id number]) -------------------------------------------------------------------------------- Problem List Details Patient Name: [name], [name] Date of Service: 09/27/2018 3:15 PM Medical Record Number: [id number] Patient Account Number: [id number] Date of Birth/Sex: May 21, 1975 (43 y.o. M) Treating RN: [name] Primary Care Provider: Pernell [name] Other Clinician: Referring Provider: Pernell [name] Treating Provider/Extender: [name] in Treatment: 0 Active Problems ICD-10 Evaluated Encounter Code Description Active Date Today Diagnosis E11.621 Type 2 diabetes mellitus with foot ulcer 09/22/2018 No Yes L97.522 Non-pressure chronic ulcer of other part of left foot with fat 09/22/2018 No Yes layer exposed I70.312 Atherosclerosis of unspecified type of bypass graft(s) of the 09/22/2018 No Yes extremities with intermittent claudication, left leg E11.40 Type 2 diabetes mellitus with diabetic neuropathy, 09/22/2018 No Yes unspecified Inactive Problems Resolved Problems Electronic Signature(s) Signed: 09/27/2018 6:06:19 PM By: Tyrone Ham MD Entered By: [name] on 09/27/2018 17:42:11 [name], [name] ([id number]) -------------------------------------------------------------------------------- Progress Note Details Patient Name: [name], [name] Date of Service: 09/27/2018 3:15 PM Medical Record Number: [id number] Patient Account Number: [id number] Date of Birth/Sex: 12-Nov-1975 (43 y.o. M) Treating RN: [name] Primary Care Provider: Pernell [name] Other Clinician: Referring Provider: Pernell [name] Treating Provider/Extender: [name] in Treatment: 0 Subjective History of Present Illness (HPI) 09/22/18 on evaluation today patient presents for initial evaluation here in our clinic regarding issues that he has been having with his left plantar foot at the base of the second toe. He tells me that this has been present since  January 2020. Subsequently she was seen by podiatry where he did have an x-ray of Aug 18, 2018. A culture was also obtained. The culture showed that he had group B strep noted but no other abnormal findings. He's been on doxycycline since that time. He is still doxycycline currently in fact is picking up another prescription today. Fortunately there's no signs  of systemic infection. 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 Last time you seen vascular as best I can tell was in 2017. This in fact that was on 05/27/15. At that point he had no evidence of hemodynamically significant inflow extraction. Overall things seem to be doing well which is excellent news. He hasn't however seen anyone since that time that has been about three years therefore I think it may be beneficial for him to go back to see them. Especially in light of having the ulceration which again if he doesn't have sufficient flow he likely is not going to heal appropriately. His ABI today was 0.93 and seems to be okay hopefully this is an indication that things are doing well. Secondarily I do think is gonna require an MRI of the left foot I'm concerned about the fact that he has bone involvement right now and likely may have a bone infection. He is on the doxycycline which is good news but again the wound has not been improving according to what he tells me. 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 7/1; patient admitted to our clinic last week. We still have not managed to arrange an MRI as it is in the [facility] approval phase. Our staff called the Medicaid office to press for an urgent processing. He is using silver alginate. 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 Pulse regular and within target range for patient.Marland Kitchen Respirations  regular, non-labored and within target range.. Temperature is normal and within the target range for the patient.Marland Kitchen appears in no distress. Vitals Time Taken: 3:16 PM, Height: 69 in, Weight: 230 lbs, BMI: 34, Temperature: 98.4 F, Pulse: 89 bpm, Respiratory Rate: 18 breaths/min, Blood Pressure: 163/99 mmHg. Eyes Conjunctivae clear. No discharge. Respiratory Respiratory effort is easy and symmetric bilaterally. Rate is normal at rest and on room air.. Cardiovascular Pedal pulses palpable and strong bilaterally.Marland Kitchen [name], [name] ([id number]) Lymphatic None palpable in the left popliteal area. Neurological Insensate. General Notes: Wound exam; the wound bed has decent granulation however this easily probes to bone which I think is the second metatarsal head. The wound itself appears to be between the second and third met heads on the left foot. There is discoloration of the skin around the wound spreading into the base of the toes also an area on the lateral part of the foot. There is no purulent drainage. He is reasonably insensate. Integumentary (Hair, Skin) No primary cutaneous issue is seen. Wound #1 status is Open. Original cause of wound was Blister. The wound is located on the [place]. The wound measures 2.5cm length x 1.7cm width x 1.1cm depth; 3.338cm^2 area and 3.672cm^3 volume. There is Fat Layer (Subcutaneous Tissue) Exposed exposed. There is no tunneling or undermining noted. There is a medium amount of serous drainage noted. The wound margin is well defined and not attached to the wound base. There is medium (34-66%) red, hyper - granulation within the wound bed. There is a small (1-33%) amount of necrotic tissue within the wound bed including Adherent Slough. 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 Primary Wound Dressing: Wound #1 Left,Plantar Foot: Silver Alginate Secondary Dressing: Wound #1 Left,Plantar Foot: Dry Gauze Conform/Kerlix Coban Dressing Change Frequency: [name], [name] ([id number]) Wound #1 Left,Plantar Foot: Change dressing every other day. Follow-up Appointments: Wound #1 Left,Plantar Foot: Return Appointment in 1 week. Off-Loading: Wound #1 Left,Plantar Foot: Other: - Cam walker boot 1. We have tried to raise the urgency of the MRI with Medicaid 2. The patient is still on doxycycline. Once we have the MRI it would be possible to stop the doxycycline and probably get a piece of bone for culture and pathology. The patient will likely need IV antibiotics as the likelihood of osteomyelitis here is very high 3. I have asked him to continually to rigorously offload this area 4. Silver alginate, we showed his girlfriend how to pack this especially in the area that probes towards bone Electronic Signature(s) Signed: 09/27/2018 6:06:19 PM By: Tyrone Ham MD Entered By: [name] on 09/27/2018 17:49:17 [name], [name] ([id number]) -------------------------------------------------------------------------------- SuperBill Details Patient Name: Liou, Shaurya A. Date of Service: 09/27/2018 Medical Record Number: [id number] Patient Account Number: [id number] Date of Birth/Sex: 07-01-1975 (43 y.o. 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

## 2018-10-03 NOTE — Progress Notes (Signed)
[name], [name] ([id number]) Visit Report for 09/27/2018 Arrival Information Details Patient Name: [name], [name] Date of Service: 09/27/2018 3:15 PM Medical Record Number: [id number] Patient Account Number: [id number] Date of Birth/Sex: 04/21/1975 (43 y.o. M) Treating RN: [name] Primary Care [name]: Bluford Mainejan-Sie, [name] Other Clinician: Referring Kameren Pargas: Bluford Mainejan-Sie, [name] Treating Janat Tabbert/Extender: [name] Weeks in Treatment: 0 Visit Information History Since Last Visit Added or deleted any medications: No Patient Arrived: Ambulatory Any new allergies or adverse reactions: No Arrival Time: 15:13 Had a fall or experienced change in No Accompanied By: self activities of daily living that may affect Transfer Assistance: None risk of falls: Patient Identification Verified: Yes Signs or symptoms of abuse/neglect since last visito No Secondary Verification Process Completed: Yes Hospitalized since last visit: No Has Dressing in Place as Prescribed: Yes Has Footwear/Offloading in Place as Prescribed: Yes Left: Other: Pain Present Now: Yes Electronic Signature(s) Signed: 09/28/2018 9:59:53 AM By: [name] Entered By: [name] on 09/27/2018 15:14:56 [name], [name] ([id number]) -------------------------------------------------------------------------------- Clinic Level of Care Assessment Details Patient Name: [name], [name] Date of Service: 09/27/2018 3:15 PM Medical Record Number: [id number] Patient Account Number: [id number] Date of Birth/Sex: 04/21/1975 (43 y.o. M) Treating RN: [name] Primary Care Dael Howland: Bluford Mainejan-Sie, [name] Other Clinician: Referring Devetta Hagenow: Bluford Mainejan-Sie, [name] Treating Nichalos Brenton/Extender: [name] Weeks in Treatment: 0 Clinic Level of Care Assessment Items TOOL 4 Quantity Score []  - Use when only an EandM is performed on FOLLOW-UP visit 0 ASSESSMENTS - Nursing Assessment / Reassessment []  - Reassessment  of Co-morbidities (includes updates in patient status) 0 X- 1 5 Reassessment of Adherence to Treatment Plan ASSESSMENTS - Wound and Skin Assessment / Reassessment X - Simple Wound Assessment / Reassessment - one wound 1 5 []  - 0 Complex Wound Assessment / Reassessment - multiple wounds []  - 0 Dermatologic / Skin Assessment (not related to wound area) ASSESSMENTS - Focused Assessment []  - Circumferential Edema Measurements - multi extremities 0 []  - 0 Nutritional Assessment / Counseling / Intervention []  - 0 Lower Extremity Assessment (monofilament, tuning fork, pulses) []  - 0 Peripheral Arterial Disease Assessment (using hand held doppler) ASSESSMENTS - Ostomy and/or Continence Assessment and Care []  - Incontinence Assessment and Management 0 []  - 0 Ostomy Care Assessment and Management (repouching, etc.) PROCESS - Coordination of Care X - Simple Patient / Family Education for ongoing care 1 15 []  - 0 Complex (extensive) Patient / Family Education for ongoing care X- 1 10 Staff obtains ChiropractorConsents, Records, Test Results / Process Orders []  - 0 Staff telephones HHA, Nursing Homes / Clarify orders / etc []  - 0 Routine Transfer to another Facility (non-emergent condition) []  - 0 Routine Hospital Admission (non-emergent condition) []  - 0 New Admissions / Manufacturing engineernsurance Authorizations / Ordering NPWT, Apligraf, etc. []  - 0 Emergency Hospital Admission (emergent condition) X- 1 10 Simple Discharge Coordination [name], [name] ([id number]) []  - 0 Complex (extensive) Discharge Coordination PROCESS - Special Needs []  - Pediatric / Minor Patient Management 0 []  - 0 Isolation Patient Management []  - 0 Hearing / Language / Visual special needs []  - 0 Assessment of Community assistance (transportation, D/C planning, etc.) []  - 0 Additional assistance / Altered mentation []  - 0 Support Surface(s) Assessment (bed, cushion, seat, etc.) INTERVENTIONS - Wound Cleansing / Measurement X  - Simple Wound Cleansing - one wound 1 5 []  - 0 Complex Wound Cleansing - multiple wounds X- 1 5 Wound Imaging (photographs - any number of wounds) []  - 0 Wound Tracing (  instead of photographs) X- 1 5 Simple Wound Measurement - one wound []  - 0 Complex Wound Measurement - multiple wounds INTERVENTIONS - Wound Dressings []  - Small Wound Dressing one or multiple wounds 0 X- 1 15 Medium Wound Dressing one or multiple wounds []  - 0 Large Wound Dressing one or multiple wounds []  - 0 Application of Medications - topical []  - 0 Application of Medications - injection INTERVENTIONS - Miscellaneous []  - External ear exam 0 []  - 0 Specimen Collection (cultures, biopsies, blood, body fluids, etc.) []  - 0 Specimen(s) / Culture(s) sent or taken to Lab for analysis []  - 0 Patient Transfer (multiple staff / Nurse, adultHoyer Lift / Similar devices) []  - 0 Simple Staple / Suture removal (25 or less) []  - 0 Complex Staple / Suture removal (26 or more) []  - 0 Hypo / Hyperglycemic Management (close monitor of Blood Glucose) []  - 0 Ankle / Brachial Index (ABI) - do not check if billed separately X- 1 5 Vital Signs [name], [name] ([id number]) Has the patient been seen at the hospital within the last three years: Yes Total Score: 80 Level Of Care: New/Established - Level 3 Electronic Signature(s) Signed: 10/02/2018 5:43:16 PM By: Tyrone GurneyWoody, BSN, RN, CWS, [name] Entered By: [name] on 09/27/2018 15:57:32 [name], Tyrone StarrRAVIS A. ([id number]) -------------------------------------------------------------------------------- Encounter Discharge Information Details Patient Name: Hands, [name] Date of Service: 09/27/2018 3:15 PM Medical Record Number: [id number] Patient Account Number: [id number] Date of Birth/Sex: 22-May-1975 (43 y.o. M) Treating RN: [name] Primary Care Deon Ivey: Bluford Mainejan-Sie, [name] Other Clinician: Referring Hamlet Lasecki: Bluford Mainejan-Sie, [name] Treating Sylus Stgermain/Extender:  [name] Weeks in Treatment: 0 Encounter Discharge Information Items Discharge Condition: Stable Ambulatory Status: Ambulatory Discharge Destination: Home Transportation: Private Auto Accompanied By: caregiver Schedule Follow-up Appointment: Yes Clinical Summary of Care: Electronic Signature(s) Signed: 10/02/2018 5:43:16 PM By: Tyrone GurneyWoody, BSN, RN, CWS, [name] Entered By: [name] on 09/27/2018 16:13:58 File, Tyrone StarrRAVIS A. ([id number]) -------------------------------------------------------------------------------- Lower Extremity Assessment Details Patient Name: [name], [name] Date of Service: 09/27/2018 3:15 PM Medical Record Number: [id number] Patient Account Number: [id number] Date of Birth/Sex: 22-May-1975 (43 y.o. M) Treating RN: [name] Primary Care Mavrick Mcquigg: Bluford Mainejan-Sie, [name] Other Clinician: Referring Robbin Loughmiller: Bluford Mainejan-Sie, [name] Treating Lacye Mccarn/Extender: [name] Weeks in Treatment: 0 Edema Assessment Assessed: [Left: No] [Right: No] [Left: Edema] [Right: :] Calf Left: Right: Point of Measurement: 33 cm From Medial Instep 39 cm cm Ankle Left: Right: Point of Measurement: 12 cm From Medial Instep 25.2 cm cm Vascular Assessment Pulses: Dorsalis Pedis Palpable: [Left:Yes] Posterior Tibial Palpable: [Left:Yes] Electronic Signature(s) Signed: 09/28/2018 9:59:53 AM By: [name] Entered By: [name] on 09/27/2018 15:22:37 [name], [name] ([id number]) -------------------------------------------------------------------------------- Multi Wound Chart Details Patient Name: [name], [name] Date of Service: 09/27/2018 3:15 PM Medical Record Number: [id number] Patient Account Number: [id number] Date of Birth/Sex: 22-May-1975 (43 y.o. M) Treating RN: [name] Primary Care Khallid Pasillas: Bluford Mainejan-Sie, [name] Other Clinician: Referring Adanya Sosinski: Bluford Mainejan-Sie, [name] Treating Hannalee Castor/Extender: [name] Weeks in  Treatment: 0 Vital Signs Height(in): 69 Pulse(bpm): 89 Weight(lbs): 230 Blood Pressure(mmHg): 163/99 Body Mass Index(BMI): 34 Temperature(F): 98.4 Respiratory Rate 18 (breaths/min): Photos: [N/A:N/A] Wound Location: Left Foot - Plantar N/A N/A Wounding Event: Blister N/A N/A Primary Etiology: Diabetic Wound/Ulcer of the N/A N/A Lower Extremity Comorbid History: Hypertension, Type II Diabetes N/A N/A Date Acquired: 05/28/2018 N/A N/A Weeks of Treatment: 0 N/A N/A Wound Status: Open N/A N/A Measurements L x W x D 2.5x1.7x1.1 N/A N/A (cm) Area (  cm) : 3.338 N/A N/A Volume (cm) : 3.672 N/A N/A % Reduction in Area: 1.60% N/A N/A % Reduction in Volume: 43.00% N/A N/A Classification: Grade 2 N/A N/A Exudate Amount: Medium N/A N/A Exudate Type: Serous N/A N/A Exudate Color: amber N/A N/A Wound Margin: Well defined, not attached N/A N/A Granulation Amount: Medium (34-66%) N/A N/A Granulation Quality: Red, Hyper-granulation N/A N/A Necrotic Amount: Small (1-33%) N/A N/A Exposed Structures: Fat Layer (Subcutaneous N/A N/A Tissue) Exposed: Yes Fascia: No Tendon: No Muscle: No Joint: No Bone: No [name], [name] ([id number]) Epithelialization: None N/A N/A Treatment Notes Wound #1 (Left, Plantar Foot) Notes SIlver alginate, abd, kerlix Electronic Signature(s) Signed: 09/27/2018 6:06:19 PM By: Linton Ham MD Entered By: Linton Ham on 09/27/2018 17:42:20 [name], [name] Kitchen ([id number]) -------------------------------------------------------------------------------- Multi-Disciplinary Care Plan Details Patient Name: Loni Dolly, Kedarius A. Date of Service: 09/27/2018 3:15 PM Medical Record Number: [id number] Patient Account Number: [id number] Date of Birth/Sex: 1975-11-30 (43 y.o. M) Treating RN: Cornell Barman Primary Care Senna Lape: Pernell Dupre Other Clinician: Referring Yanel Dombrosky: Pernell Dupre Treating Alleyah Twombly/Extender: Tito Dine in Treatment:  0 Active Inactive Pressure Nursing Diagnoses: Knowledge deficit related to causes and risk factors for pressure ulcer development Knowledge deficit related to management of pressures ulcers Goals: Patient/caregiver will verbalize risk factors for pressure ulcer development Date Initiated: 09/22/2018 Target Resolution Date: 10/23/2018 Goal Status: Active Interventions: Assess: immobility, friction, shearing, incontinence upon admission and as needed Assess offloading mechanisms upon admission and as needed Assess potential for pressure ulcer upon admission and as needed Provide education on pressure ulcers Notes: Wound/Skin Impairment Nursing Diagnoses: Impaired tissue integrity Goals: Ulcer/skin breakdown will have a volume reduction of 30% by week 4 Date Initiated: 09/22/2018 Target Resolution Date: 10/23/2018 Goal Status: Active Interventions: Assess patient/caregiver ability to obtain necessary supplies Assess patient/caregiver ability to perform ulcer/skin care regimen upon admission and as needed Assess ulceration(s) every visit Provide education on ulcer and skin care Notes: Electronic Signature(s) Signed: 10/02/2018 5:43:16 PM By: Gretta Cool, BSN, RN, CWS, [name] Entered By: Gretta Cool, BSN, RN, CWS, [name] on 09/27/2018 15:53:22 Salamon, Demontae A. ([id number]) Burback, Joshuan A. ([id number]) -------------------------------------------------------------------------------- Pain Assessment Details Patient Name: [name], [name] Date of Service: 09/27/2018 3:15 PM Medical Record Number: [id number] Patient Account Number: [id number] Date of Birth/Sex: [birth year] (43 y.o. M) Treating RN: Harold Barban Primary Care Gemayel Mascio: Pernell Dupre Other Clinician: Referring Devora Tortorella: Pernell Dupre Treating Denya Buckingham/Extender: Tito Dine in Treatment: 0 Active Problems Location of Pain Severity and Description of Pain Patient Has Paino Yes Site Locations Rate the  pain. Current Pain Level: 8 Character of Pain Describe the Pain: Throbbing Pain Management and Medication Current Pain Management: Electronic Signature(s) Signed: 09/28/2018 9:59:53 AM By: Harold Barban Entered By: Harold Barban on 09/27/2018 15:16:37 [name], [name] Kitchen ([id number]) -------------------------------------------------------------------------------- Patient/Caregiver Education Details Patient Name: Tyrone Auerbach A. Date of Service: 09/27/2018 3:15 PM Medical Record Number: [id number] Patient Account Number: [id number] Date of Birth/Gender: 1975-09-13 (43 y.o. M) Treating RN: Cornell Barman Primary Care Physician: Pernell Dupre Other Clinician: Referring Physician: Pernell Dupre Treating Physician/Extender: Tito Dine in Treatment: 0 Education Assessment Education Provided To: Patient Education Topics Provided Pressure: Wound/Skin Impairment: Handouts: Caring for Your Ulcer Methods: Demonstration, Explain/Verbal Responses: State content correctly Electronic Signature(s) Signed: 10/02/2018 5:43:16 PM By: Gretta Cool, BSN, RN, CWS, [name] Entered By: Gretta Cool, BSN, RN, CWS, [name] on 09/27/2018 15:58:11 Mitnick, Niklas [name] Kitchen ([id number]) -------------------------------------------------------------------------------- Wound Assessment Details Patient Name: Reth, Jashun A. Date of Service: 09/27/2018 3:15 PM Medical Record Number: [id number]  Patient Account Number: [id number] Date of Birth/Sex: [birth year] (43 y.o. M) Treating RN: [name] Primary Care Lavonda Thal: Bluford Mainejan-Sie, [name] Other Clinician: Referring Braxen Dobek: Bluford Mainejan-Sie, [name] Treating Dashanna Kinnamon/Extender: [name] Weeks in Treatment: 0 Wound Status Wound Number: 1 Primary Etiology: Diabetic Wound/Ulcer of the Lower Extremity Wound Location: Left Foot - Plantar Wound Status: Open Wounding Event: Blister Comorbid Hypertension, Type II Diabetes Date Acquired:  05/28/2018 History: Weeks Of Treatment: 0 Clustered Wound: No Photos Wound Measurements Length: (cm) 2.5 Width: (cm) 1.7 Depth: (cm) 1.1 Area: (cm) 3.338 Volume: (cm) 3.672 % Reduction in Area: 1.6% % Reduction in Volume: 43% Epithelialization: None Tunneling: No Undermining: No Wound Description Classification: Grade 2 Foul Odor Wound Margin: Well defined, not attached Slough/Fib Exudate Amount: Medium Exudate Type: Serous Exudate Color: amber After Cleansing: No rino Yes Wound Bed Granulation Amount: Medium (34-66%) Exposed Structure Granulation Quality: Red, Hyper-granulation Fascia Exposed: No Necrotic Amount: Small (1-33%) Fat Layer (Subcutaneous Tissue) Exposed: Yes Necrotic Quality: Adherent Slough Tendon Exposed: No Muscle Exposed: No Joint Exposed: No Bone Exposed: No Treatment Notes Vinton, Kidus A. ([id number]) Wound #1 (Left, Plantar Foot) Notes SIlver alginate, abd, kerlix Electronic Signature(s) Signed: 09/28/2018 9:59:53 AM By: [name] Entered By: [name] on 09/27/2018 15:22:05 Hershey, Melchizedek A. ([id number]) -------------------------------------------------------------------------------- Vitals Details Patient Name: Vanmetre, Quintez A. Date of Service: 09/27/2018 3:15 PM Medical Record Number: [id number] Patient Account Number: [id number] Date of Birth/Sex: [birth year] (43 y.o. M) Treating RN: [name] Primary Care Annete Ayuso: Bluford Mainejan-Sie, [name] Other Clinician: Referring Gesenia Bantz: Bluford Mainejan-Sie, [name] Treating Isais Klipfel/Extender: [name] Weeks in Treatment: 0 Vital Signs Time Taken: 15:16 Temperature (F): 98.4 Height (in): 69 Pulse (bpm): 89 Weight (lbs): 230 Respiratory Rate (breaths/min): 18 Body Mass Index (BMI): 34 Blood Pressure (mmHg): 163/99 Reference Range: 80 - 120 mg / dl Electronic Signature(s) Signed: 09/28/2018 9:59:53 AM By: [name] Entered By: [name] on 09/27/2018 15:17:43

## 2018-10-06 ENCOUNTER — Ambulatory Visit: Payer: PRIVATE HEALTH INSURANCE | Admitting: Physician Assistant

## 2018-10-06 ENCOUNTER — Ambulatory Visit: Payer: PRIVATE HEALTH INSURANCE | Admitting: Podiatry

## 2018-10-09 ENCOUNTER — Other Ambulatory Visit: Payer: Self-pay

## 2018-10-09 ENCOUNTER — Other Ambulatory Visit: Payer: Self-pay | Admitting: Internal Medicine

## 2018-10-09 ENCOUNTER — Encounter: Payer: Medicaid Other | Admitting: Internal Medicine

## 2018-10-09 DIAGNOSIS — L97522 Non-pressure chronic ulcer of other part of left foot with fat layer exposed: Secondary | ICD-10-CM

## 2018-10-09 DIAGNOSIS — E11621 Type 2 diabetes mellitus with foot ulcer: Secondary | ICD-10-CM | POA: Diagnosis not present

## 2018-10-09 NOTE — Progress Notes (Signed)
[name], [place] A. ([id number]) Visit Report for 10/09/2018 HPI Details Patient Name: Dolinsky, Amram A. Date of Service: 10/09/2018 10:45 AM Medical Record Number: [id number] Patient Account Number: [id number] Date of Birth/Sex: 05/10/1975 (43 y.o. M) Treating RN: Arnette NorrisBiell, Kristina Primary Care Provider: Bluford Mainejan-Sie, Sheikh Other Clinician: Referring Provider: Bluford Mainejan-Sie, Sheikh Treating Provider/Extender: Maryla MorrowMadduri, Murthy Weeks in Treatment: 2 History of Present Illness HPI Description: 09/22/18 on evaluation today patient presents for initial evaluation here in our clinic regarding issues that he has been having with his left plantar foot at the base of the second toe. He tells me that this has been present since January 2020. Subsequently she was seen by podiatry where he did have an x-ray of Aug 18, 2018. A culture was also obtained. The culture showed that he had group B strep noted but no other abnormal findings. He's been on doxycycline since that time. He is still doxycycline currently in fact is picking up another prescription today. Fortunately there's no signs of systemic infection. 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 Last time you seen vascular as best I can tell was in 2017. This in fact that was on 05/27/15. At that point he had no evidence of hemodynamically significant inflow extraction. Overall things seem to be doing well which is excellent news. He hasn't however seen anyone since that time that has been about three years therefore I think it may be beneficial for him to go back to see them. Especially in light of having the ulceration which again if he doesn't have sufficient flow he likely is not going to heal appropriately. His ABI today was 0.93 and seems to be okay hopefully this is an indication that things are doing  well. Secondarily I do think is gonna require an MRI of the left foot I'm concerned about the fact that he has bone involvement right now and likely may have a bone infection. He is on the doxycycline which is good news but again the wound has not been improving according to what he tells me. 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 7/1; patient admitted to our clinic last week. We still have not managed to arrange an MRI [facility] Our staff called the Medicaid office to press for an urgent processing. He is using silver alginate. He has a Darco forefoot off loader 10/09/18-Patient returns at 2 weeks to clinic has been on doxycycline, insurance is denying MRI authorization, we are using silver cell dressing and Darco forefoot off loader not much change to the wound so far Electronic Signature(s) Signed: 10/09/2018 11:09:08 AM By: Cassandria AngerMadduri, Murthy Entered By: Cassandria AngerMadduri, Murthy on 10/09/2018 11:09:07 Puig, Kayveon A. ([id number]) -------------------------------------------------------------------------------- Physical Exam Details Patient Name: Krenz, Dangelo A. Date of Service: 10/09/2018 10:45 AM Medical Record Number: [id number] Patient Account Number: [id number] Date of Birth/Sex: 05/10/1975 (43 y.o. M) Treating RN: Arnette NorrisBiell, Kristina Primary Care Provider: Bluford Mainejan-Sie, Sheikh Other Clinician: Referring Provider: Bluford Mainejan-Sie, Sheikh Treating Provider/Extender: Maryla MorrowMadduri, Murthy Weeks in Treatment: 2 Constitutional alert and oriented x 3. sitting or standing blood pressure is within target range for patient.. supine blood pressure is within target range for patient.. pulse regular and within target range for patient.Marland Kitchen. respirations regular, non-labored and within target range for patient.Marland Kitchen. temperature within target range for patient.. . . Well-nourished and well-hydrated in no acute distress. Notes Wound  looks about the same, healthy-appearing granulation tissue, no  necrotic areas, periwound with discoloration and slight maceration, wound depth probes to bone, bone is not visible. Electronic Signature(s) Signed: 10/09/2018 11:09:58 AM By: Tobi Bastos Entered By: Tobi Bastos on 10/09/2018 11:09:58 Labell, Tahmir AMarland Kitchen ([id number]) -------------------------------------------------------------------------------- Physician Orders Details Patient Name: Jarriel, Diem A. Date of Service: 10/09/2018 10:45 AM Medical Record Number: [id number] Patient Account Number: [id number] Date of Birth/Sex: 01-08-1976 (43 y.o. M) Treating RN: Harold Barban Primary Care Provider: Pernell Dupre Other Clinician: Referring Provider: Pernell Dupre Treating Provider/Extender: Beverly Gust in Treatment: 2 Verbal / Phone Orders: No Diagnosis Coding Patient Medications Allergies: no known allergies Notifications Medication Indication Start End doxycycline monohydrate 10/09/2018 DOSE 1 - oral 100 mg tablet - 1 tablet oral twice daily x 10 days Electronic Signature(s) Signed: 10/09/2018 11:08:09 AM By: Tobi Bastos Entered By: Tobi Bastos on 10/09/2018 11:08:09 Dowse, Leory A. ([id number]) -------------------------------------------------------------------------------- Progress Note Details Patient Name: Peplinski, Aldyn A. Date of Service: 10/09/2018 10:45 AM Medical Record Number: [id number] Patient Account Number: [id number] Date of Birth/Sex: 06/26/1975 (43 y.o. M) Treating RN: Harold Barban Primary Care Provider: Pernell Dupre Other Clinician: Referring Provider: Pernell Dupre Treating Provider/Extender: Beverly Gust in Treatment: 2 Subjective History of Present Illness (HPI) 09/22/18 on evaluation today patient presents for initial evaluation here in our clinic regarding issues that he has been having with his left plantar foot at the base of the second toe. He tells me that this has been present since January  2020. Subsequently she was seen by podiatry where he did have an x-ray of Aug 18, 2018. A culture was also obtained. The culture showed that he had group B strep noted but no other abnormal findings. He's been on doxycycline since that time. He is still doxycycline currently in fact is picking up another prescription today. Fortunately there's no signs of systemic infection. 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 Last time you seen vascular as best I can tell was in 2017. This in fact that was on 05/27/15. At that point he had no evidence of hemodynamically significant inflow extraction. Overall things seem to be doing well which is excellent news. He hasn't however seen anyone since that time that has been about three years therefore I think it may be beneficial for him to go back to see them. Especially in light of having the ulceration which again if he doesn't have sufficient flow he likely is not going to heal appropriately. His ABI today was 0.93 and seems to be okay hopefully this is an indication that things are doing well. Secondarily I do think is gonna require an MRI of the left foot I'm concerned about the fact that he has bone involvement right now and likely may have a bone infection. He is on the doxycycline which is good news but again the wound has not been improving according to what he tells me. 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 7/1; patient admitted to our clinic last week. We still have not managed to arrange an MRI as it is in the [facility] approval phase. Our staff called the Medicaid office to press for an urgent processing. He is using silver alginate. He has a Darco forefoot off loader 10/09/18-Patient returns at 2 weeks to clinic has been on doxycycline, insurance is denying MRI authorization, we are  using silver  cell dressing and Darco forefoot off loader not much change to the wound so far Objective Constitutional alert and oriented x 3. sitting or standing blood pressure is within target range for patient.. supine blood pressure is within target range for patient.. pulse regular and within target range for patient.Marland Kitchen. respirations regular, non-labored and within target range for patient.Marland Kitchen. temperature within target range for patient.. Well-nourished and well-hydrated in no acute distress. Vitals Time Taken: 10:50 AM, Height: 69 in, Weight: 230 lbs, BMI: 34, Temperature: 98.2 F, Pulse: 68 bpm, Respiratory Rate: 18 breaths/min, Blood Pressure: 152/90 mmHg. General Notes: Wound looks about the same, healthy-appearing granulation tissue, no necrotic areas, periwound with discoloration and slight maceration, wound depth probes to bone, bone is not visible. Ragon, Nils A. ([id number]) Integumentary (Hair, Skin) Wound #1 status is Open. Original cause of wound was Blister. The wound is located on the Left,Plantar Foot. The wound measures 2.5cm length x 1.8cm width x 1.5cm depth; 3.534cm^2 area and 5.301cm^3 volume. There is Fat Layer (Subcutaneous Tissue) Exposed exposed. There is no tunneling noted, however, there is undermining starting at 9:00 and ending at 1:00 with a maximum distance of 0.6cm. There is a medium amount of serous drainage noted. The wound margin is well defined and not attached to the wound base. There is medium (34-66%) red, hyper - granulation within the wound bed. There is a small (1-33%) amount of necrotic tissue within the wound bed including Adherent Slough. Plan The following medication(s) was prescribed: doxycycline monohydrate oral 100 mg tablet 1 1 tablet oral twice daily x 10 days starting 10/09/2018 1. 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 Will renew-extend doxycycline 100 mg twice daily 3. Return to clinic next week using silver alginate dressing Electronic Signature(s) Signed: 10/09/2018 11:11:50 AM By: Cassandria AngerMadduri, Murthy Entered By: Cassandria AngerMadduri, Murthy on 10/09/2018 11:11:49 Pollett, Denim A. ([id number]) -------------------------------------------------------------------------------- SuperBill Details Patient Name: Pangilinan, Benjamen A. Date of Service: 10/09/2018 Medical Record Number: [id number] Patient Account Number: [id number] Date of Birth/Sex: 09-16-1975 (43 y.o. 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

## 2018-10-10 NOTE — Progress Notes (Signed)
[name], Amore A. ([id number]) Visit Report for 10/09/2018 Arrival Information Details Patient Name: [name], [name] Date of Service: 10/09/2018 10:45 AM Medical Record Number: [id number] Patient Account Number: [id number] Date of Birth/Sex: Sep 01, 1975 (43 y.o. M) Treating RN: [name] Primary Care [name] Other Clinician: Referring [name] Treating [name] Weeks in Treatment: 2 Visit Information History Since Last Visit Added or deleted any medications: No Patient Arrived: Ambulatory Any new allergies or adverse reactions: No Arrival Time: 10:50 Had a fall or experienced change in No Accompanied By: self activities of daily living that may affect Transfer Assistance: None risk of falls: Patient Identification Verified: Yes Signs or symptoms of abuse/neglect since last visito No Secondary Verification Process Completed: Yes Hospitalized since last visit: No Has Dressing in Place as Prescribed: Yes Has Compression in Place as Prescribed: No Pain Present Now: Yes Electronic Signature(s) Signed: 10/09/2018 5:21:59 PM By: [name] Entered By: [name] on 10/09/2018 10:51:34 [name], [name] ([id number]) -------------------------------------------------------------------------------- Clinic Level of Care Assessment Details Patient Name: [name], [name] Date of Service: 10/09/2018 10:45 AM Medical Record Number: [id number] Patient Account Number: [id number] Date of Birth/Sex: Sep 01, 1975 (43 y.o. M) Treating RN: [name] Primary Care Saquoia Sianez: Bluford [name] Other Clinician: Referring Tyrek Lawhorn: Bluford [name] Treating Alinah Sheard/Extender: [name] Weeks in Treatment: 2 Clinic Level of Care Assessment Items TOOL 4 Quantity Score []  - Use when only an EandM is performed on FOLLOW-UP visit 0 ASSESSMENTS - Nursing Assessment / Reassessment X - Reassessment of  Co-morbidities (includes updates in patient status) 1 10 X- 1 5 Reassessment of Adherence to Treatment Plan ASSESSMENTS - Wound and Skin Assessment / Reassessment X - Simple Wound Assessment / Reassessment - one wound 1 5 []  - 0 Complex Wound Assessment / Reassessment - multiple wounds []  - 0 Dermatologic / Skin Assessment (not related to wound area) ASSESSMENTS - Focused Assessment []  - Circumferential Edema Measurements - multi extremities 0 []  - 0 Nutritional Assessment / Counseling / Intervention []  - 0 Lower Extremity Assessment (monofilament, tuning fork, pulses) []  - 0 Peripheral Arterial Disease Assessment (using hand held doppler) ASSESSMENTS - Ostomy and/or Continence Assessment and Care []  - Incontinence Assessment and Management 0 []  - 0 Ostomy Care Assessment and Management (repouching, etc.) PROCESS - Coordination of Care X - Simple Patient / Family Education for ongoing care 1 15 []  - 0 Complex (extensive) Patient / Family Education for ongoing care X- 1 10 Staff obtains ChiropractorConsents, Records, Test Results / Process Orders []  - 0 Staff telephones HHA, Nursing Homes / Clarify orders / etc []  - 0 Routine Transfer to another Facility (non-emergent condition) []  - 0 Routine Hospital Admission (non-emergent condition) []  - 0 New Admissions / Manufacturing engineernsurance Authorizations / Ordering NPWT, Apligraf, etc. []  - 0 Emergency Hospital Admission (emergent condition) X- 1 10 Simple Discharge Coordination [name], [name] ([id number]) []  - 0 Complex (extensive) Discharge Coordination PROCESS - Special Needs []  - Pediatric / Minor Patient Management 0 []  - 0 Isolation Patient Management []  - 0 Hearing / Language / Visual special needs []  - 0 Assessment of Community assistance (transportation, D/C planning, etc.) []  - 0 Additional assistance / Altered mentation []  - 0 Support Surface(s) Assessment (bed, cushion, seat, etc.) INTERVENTIONS - Wound Cleansing / Measurement X  - Simple Wound Cleansing - one wound 1 5 []  - 0 Complex Wound Cleansing - multiple wounds X- 1 5 Wound Imaging (photographs - any number of wounds) []  - 0 Wound Tracing (instead of photographs)  X- 1 5 Simple Wound Measurement - one wound []  - 0 Complex Wound Measurement - multiple wounds INTERVENTIONS - Wound Dressings X - Small Wound Dressing one or multiple wounds 1 10 []  - 0 Medium Wound Dressing one or multiple wounds []  - 0 Large Wound Dressing one or multiple wounds []  - 0 Application of Medications - topical []  - 0 Application of Medications - injection INTERVENTIONS - Miscellaneous []  - External ear exam 0 []  - 0 Specimen Collection (cultures, biopsies, blood, body fluids, etc.) []  - 0 Specimen(s) / Culture(s) sent or taken to Lab for analysis []  - 0 Patient Transfer (multiple staff / Nurse, adultHoyer Lift / Similar devices) []  - 0 Simple Staple / Suture removal (25 or less) []  - 0 Complex Staple / Suture removal (26 or more) []  - 0 Hypo / Hyperglycemic Management (close monitor of Blood Glucose) []  - 0 Ankle / Brachial Index (ABI) - do not check if billed separately X- 1 5 Vital Signs [name], [name] ([id number]) Has the patient been seen at the hospital within the last three years: Yes Total Score: 85 Level Of Care: New/Established - Level 3 Electronic Signature(s) Signed: 10/09/2018 5:21:59 PM By: [name] Entered By: [name] on 10/09/2018 11:04:08 [name], [name] ([id number]) -------------------------------------------------------------------------------- Encounter Discharge Information Details Patient Name: [name], [name] Date of Service: 10/09/2018 10:45 AM Medical Record Number: [id number] Patient Account Number: [id number] Date of Birth/Sex: 1975-12-29 (43 y.o. M) Treating RN: Rodell PernaScott, Dajea Primary Care Aracelys Glade: Bluford [name] Other Clinician: Referring Alwin Lanigan: Bluford [name] Treating Tommie Dejoseph/Extender: [name] Weeks  in Treatment: 2 Encounter Discharge Information Items Discharge Condition: Stable Ambulatory Status: Ambulatory Discharge Destination: Home Transportation: Private Auto Accompanied By: self Schedule Follow-up Appointment: Yes Clinical Summary of Care: Electronic Signature(s) Signed: 10/09/2018 11:19:00 AM By: Rodell PernaScott, Dajea Entered By: Rodell PernaScott, Dajea on 10/09/2018 11:15:17 [name], [name] ([id number]) -------------------------------------------------------------------------------- Lower Extremity Assessment Details Patient Name: Bosques, Erol A. Date of Service: 10/09/2018 10:45 AM Medical Record Number: [id number] Patient Account Number: [id number] Date of Birth/Sex: 1975-12-29 (43 y.o. M) Treating RN: [name] Primary Care Itzia Cunliffe: Bluford [name] Other Clinician: Referring Gabor Lusk: Bluford [name] Treating Zionah Criswell/Extender: [name] Weeks in Treatment: 2 Edema Assessment Assessed: [Left: No] Franne Forts[Right: No] [Left: Edema] [Right: :] Calf Left: Right: Point of Measurement: 33 cm From Medial Instep 39 cm cm Ankle Left: Right: Point of Measurement: 12 cm From Medial Instep 25 cm cm Vascular Assessment Pulses: Dorsalis Pedis Palpable: [Left:Yes] Posterior Tibial Palpable: [Left:Yes] Electronic Signature(s) Signed: 10/09/2018 5:21:59 PM By: [name] Entered By: [name] on 10/09/2018 10:58:45 Prude, Garris A. ([id number]) -------------------------------------------------------------------------------- Multi Wound Chart Details Patient Name: Mader, Antonis A. Date of Service: 10/09/2018 10:45 AM Medical Record Number: [id number] Patient Account Number: [id number] Date of Birth/Sex: 1975-12-29 (43 y.o. M) Treating RN: [name] Primary Care Phong Isenberg: Bluford [name] Other Clinician: Referring Rudene Poulsen: Bluford [name] Treating Alyza Artiaga/Extender: [name] Weeks in Treatment: 2 Vital Signs Height(in): 69 Pulse(bpm):  68 Weight(lbs): 230 Blood Pressure(mmHg): 152/90 Body Mass Index(BMI): 34 Temperature(F): 98.2 Respiratory Rate 18 (breaths/min): Photos: [N/A:N/A] Wound Location: Left Foot - Plantar N/A N/A Wounding Event: Blister N/A N/A Primary Etiology: Diabetic Wound/Ulcer of the N/A N/A Lower Extremity Comorbid History: Hypertension, Type II Diabetes N/A N/A Date Acquired: 05/28/2018 N/A N/A Weeks of Treatment: 2 N/A N/A Wound Status: Open N/A N/A Measurements L x W x D 2.5x1.8x1.5 N/A N/A (cm) Area (cm) : 3.534 N/A N/A Volume (cm) : 5.301 N/A N/A % Reduction in Area: -4.20% N/A N/A % Reduction in  Volume: 17.80% N/A N/A Starting Position 1 9 (o'clock): Ending Position 1 1 (o'clock): Maximum Distance 1 (cm): 0.6 Undermining: Yes N/A N/A Classification: Grade 2 N/A N/A Exudate Amount: Medium N/A N/A Exudate Type: Serous N/A N/A Exudate Color: amber N/A N/A Wound Margin: Well defined, not attached N/A N/A Granulation Amount: Medium (34-66%) N/A N/A Granulation Quality: Red, Hyper-granulation N/A N/A Necrotic Amount: Small (1-33%) N/A N/A Exposed Structures: N/A N/A [name], [name] ([id number]) Fat Layer (Subcutaneous Tissue) Exposed: Yes Fascia: No Tendon: No Muscle: No Joint: No Bone: No Epithelialization: None N/A N/A Treatment Notes Electronic Signature(s) Signed: 10/09/2018 5:21:59 PM By: Harold Barban Entered By: Harold Barban on 10/09/2018 11:00:40 [name], Tyrone AMarland Kitchen ([id number]) -------------------------------------------------------------------------------- Multi-Disciplinary Care Plan Details Patient Name: Bunyard, Sakib A. Date of Service: 10/09/2018 10:45 AM Medical Record Number: [id number] Patient Account Number: [id number] Date of Birth/Sex: 09-01-1975 (43 y.o. M) Treating RN: Harold Barban Primary Care Noe Pittsley: Pernell Dupre Other Clinician: Referring Carrol Bondar: Pernell Dupre Treating Sherice Ijames/Extender: Beverly Gust in  Treatment: 2 Active Inactive Pressure Nursing Diagnoses: Knowledge deficit related to causes and risk factors for pressure ulcer development Knowledge deficit related to management of pressures ulcers Goals: Patient/caregiver will verbalize risk factors for pressure ulcer development Date Initiated: 09/22/2018 Target Resolution Date: 10/23/2018 Goal Status: Active Interventions: Assess: immobility, friction, shearing, incontinence upon admission and as needed Assess offloading mechanisms upon admission and as needed Assess potential for pressure ulcer upon admission and as needed Provide education on pressure ulcers Notes: Wound/Skin Impairment Nursing Diagnoses: Impaired tissue integrity Goals: Ulcer/skin breakdown will have a volume reduction of 30% by week 4 Date Initiated: 09/22/2018 Target Resolution Date: 10/23/2018 Goal Status: Active Interventions: Assess patient/caregiver ability to obtain necessary supplies Assess patient/caregiver ability to perform ulcer/skin care regimen upon admission and as needed Assess ulceration(s) every visit Provide education on ulcer and skin care Notes: Electronic Signature(s) Signed: 10/09/2018 5:21:59 PM By: Harold Barban Entered By: Harold Barban on 10/09/2018 10:58:55 [name], [name] ([id number]) [name], [name] ([id number]) -------------------------------------------------------------------------------- Pain Assessment Details Patient Name: [name], [name] Date of Service: 10/09/2018 10:45 AM Medical Record Number: [id number] Patient Account Number: [id number] Date of Birth/Sex: [birth year] (43 y.o. M) Treating RN: Harold Barban Primary Care Kaizer Dissinger: Pernell Dupre Other Clinician: Referring Dali Kraner: Pernell Dupre Treating Jeraline Marcinek/Extender: Beverly Gust in Treatment: 2 Active Problems Location of Pain Severity and Description of Pain Patient Has Paino Yes Site Locations Rate the pain. Current  Pain Level: 10 Worst Pain Level: 10 Pain Management and Medication Current Pain Management: Notes Pain on dorsal foot and across toes Electronic Signature(s) Signed: 10/09/2018 5:21:59 PM By: Harold Barban Entered By: Harold Barban on 10/09/2018 10:58:18 [name], Tyrone AMarland Kitchen ([id number]) -------------------------------------------------------------------------------- Patient/Caregiver Education Details Patient Name: Anastasio Auerbach A. Date of Service: 10/09/2018 10:45 AM Medical Record Number: [id number] Patient Account Number: [id number] Date of Birth/Gender: Jan 08, 1976 (43 y.o. M) Treating RN: Harold Barban Primary Care Physician: Pernell Dupre Other Clinician: Referring Physician: Pernell Dupre Treating Physician/Extender: Beverly Gust in Treatment: 2 Education Assessment Education Provided To: Patient Education Topics Provided Wound/Skin Impairment: Handouts: Caring for Your Ulcer Methods: Demonstration, Explain/Verbal Responses: State content correctly Electronic Signature(s) Signed: 10/09/2018 5:21:59 PM By: Harold Barban Entered By: Harold Barban on 10/09/2018 11:01:03 [name], [name] ([id number]) -------------------------------------------------------------------------------- Wound Assessment Details Patient Name: Storer, Kimble A. Date of Service: 10/09/2018 10:45 AM Medical Record Number: [id number] Patient Account Number: [id number] Date of Birth/Sex: 29-Apr-1975 (43 y.o. M) Treating RN: Harold Barban Primary Care Jalyah Weinheimer: Pernell Dupre Other Clinician: Referring Jenicka Coxe: Pernell Dupre Treating Cyprian Gongaware/Extender:  Madduri, August SaucerMurthy Weeks in Treatment: 2 Wound Status Wound Number: 1 Primary Etiology: Diabetic Wound/Ulcer of the Lower Extremity Wound Location: Left Foot - Plantar Wound Status: Open Wounding Event: Blister Comorbid Hypertension, Type II Diabetes Date Acquired: 05/28/2018 History: Weeks Of Treatment:  2 Clustered Wound: No Photos Wound Measurements Length: (cm) 2.5 % Reduction Width: (cm) 1.8 % Reduction Depth: (cm) 1.5 Epitheliali Area: (cm) 3.534 Tunneling: Volume: (cm) 5.301 Underminin Starting Ending P Maximum in Area: -4.2% in Volume: 17.8% zation: None No g: Yes Position (o'clock): 9 osition (o'clock): 1 Distance: (cm) 0.6 Wound Description Classification: Grade 2 Foul Odor A Wound Margin: Well defined, not attached Slough/Fibr Exudate Amount: Medium Exudate Type: Serous Exudate Color: amber fter Cleansing: No ino Yes Wound Bed Granulation Amount: Medium (34-66%) Exposed Structure Granulation Quality: Red, Hyper-granulation Fascia Exposed: No Necrotic Amount: Small (1-33%) Fat Layer (Subcutaneous Tissue) Exposed: Yes Necrotic Quality: Adherent Slough Tendon Exposed: No Muscle Exposed: No Joint Exposed: No Boehle, Lesly A. ([id number]) Bone Exposed: No Treatment Notes Wound #1 (Left, Plantar Foot) Notes SIlver alginate, gauze, conform, coban Electronic Signature(s) Signed: 10/09/2018 5:21:59 PM By: [name] Entered By: [name] on 10/09/2018 10:57:33 Siler, Jakobe A. ([id number]) -------------------------------------------------------------------------------- Vitals Details Patient Name: Rosetti, Rucker A. Date of Service: 10/09/2018 10:45 AM Medical Record Number: [id number] Patient Account Number: [id number] Date of Birth/Sex: [birth year] (43 y.o. M) Treating RN: [name] Primary Care Riva Sesma: Bluford [name] Other Clinician: Referring Mayla Biddy: Bluford [name] Treating Xayla Puzio/Extender: [name] Weeks in Treatment: 2 Vital Signs Time Taken: 10:50 Temperature (F): 98.2 Height (in): 69 Pulse (bpm): 68 Weight (lbs): 230 Respiratory Rate (breaths/min): 18 Body Mass Index (BMI): 34 Blood Pressure (mmHg): 152/90 Reference Range: 80 - 120 mg / dl Electronic Signature(s) Signed: 10/09/2018 5:21:59 PM By:  [name] Entered By: [name] on 10/09/2018 10:54:26

## 2018-10-13 ENCOUNTER — Other Ambulatory Visit: Payer: Self-pay

## 2018-10-13 ENCOUNTER — Ambulatory Visit
Admission: RE | Admit: 2018-10-13 | Discharge: 2018-10-13 | Disposition: A | Discharge status: Home / Self Care | Payer: Medicaid Other | Source: Ambulatory Visit | Attending: Internal Medicine | Admitting: Internal Medicine

## 2018-10-13 DIAGNOSIS — L97522 Non-pressure chronic ulcer of other part of left foot with fat layer exposed: Secondary | ICD-10-CM | POA: Insufficient documentation

## 2018-10-16 ENCOUNTER — Ambulatory Visit: Payer: PRIVATE HEALTH INSURANCE | Admitting: Physician Assistant

## 2018-10-20 ENCOUNTER — Encounter: Payer: Medicaid Other | Admitting: Physician Assistant

## 2018-10-20 ENCOUNTER — Other Ambulatory Visit
Admission: RE | Admit: 2018-10-20 | Discharge: 2018-10-20 | Disposition: A | Discharge status: Home / Self Care | Payer: Medicaid Other | Source: Ambulatory Visit | Attending: Physician Assistant | Admitting: Physician Assistant

## 2018-10-20 ENCOUNTER — Other Ambulatory Visit: Payer: Self-pay

## 2018-10-20 DIAGNOSIS — B999 Unspecified infectious disease: Secondary | ICD-10-CM | POA: Insufficient documentation

## 2018-10-20 NOTE — Progress Notes (Signed)
[name], Demarqus A. ([id number]) Visit Report for 10/20/2018 Allergy List Details Patient Name: [name], [name] Date of Service: 10/20/2018 8:30 AM Medical Record Number: [id number] Patient Account Number: [id number] Date of Birth/Sex: 20-May-1975 (43 y.o. Male) Treating RN: Curtis Sitesorthy, Joanna Primary Care Laverne Hursey: Bluford Mainejan-Sie, Sheikh Other Clinician: Referring Geneveive Furness: Bluford Mainejan-Sie, Sheikh Treating Quintana Canelo/Extender: Linwood DibblesSTONE III, HOYT Weeks in Treatment: 4 Allergies Active Allergies No Known Drug Allergies Allergy Notes Electronic Signature(s) Signed: 10/20/2018 10:03:53 AM By: Curtis Sitesorthy, Joanna Entered By: Curtis Sitesorthy, Joanna on 10/20/2018 10:03:53 Jaworski, Alexander AMarland Kitchen. ([id number]) -------------------------------------------------------------------------------- Arrival Information Details Patient Name: [name], [name] Date of Service: 10/20/2018 8:30 AM Medical Record Number: [id number] Patient Account Number: [id number] Date of Birth/Sex: 20-May-1975 (43 y.o. Male) Treating RN: Rodell PernaScott, Dajea Primary Care Nic Lampe: Bluford Mainejan-Sie, Sheikh Other Clinician: Referring Katriana Dortch: Bluford Mainejan-Sie, Sheikh Treating Shellia Hartl/Extender: Linwood DibblesSTONE III, HOYT Weeks in Treatment: 4 Visit Information Patient Arrived: Ambulatory Arrival Time: 08:35 Accompanied By: self Transfer Assistance: None History Since Last Visit All ordered tests and consults were completed: No Added or deleted any medications: No Any new allergies or adverse reactions: No Had a fall or experienced change in activities of daily living that may affect risk of falls: No Signs or symptoms of abuse/neglect since last visito No Hospitalized since last visit: No Has Dressing in Place as Prescribed: Yes Electronic Signature(s) Signed: 10/20/2018 11:47:58 AM By: Rodell PernaScott, Dajea Entered By: Rodell PernaScott, Dajea on 10/20/2018 08:35:51 [name], [name] ([id number]) -------------------------------------------------------------------------------- Clinic Level of Care  Assessment Details Patient Name: [name], [name] Date of Service: 10/20/2018 8:30 AM Medical Record Number: [id number] Patient Account Number: [id number] Date of Birth/Sex: 20-May-1975 (43 y.o. Male) Treating RN: Curtis Sitesorthy, Joanna Primary Care Staci Carver: Bluford Mainejan-Sie, Sheikh Other Clinician: Referring Jasmon Graffam: Bluford Mainejan-Sie, Sheikh Treating Masahiro Iglesia/Extender: Linwood DibblesSTONE III, HOYT Weeks in Treatment: 4 Clinic Level of Care Assessment Items TOOL 4 Quantity Score []  - Use when only an EandM is performed on FOLLOW-UP visit 0 ASSESSMENTS - Nursing Assessment / Reassessment X - Reassessment of Co-morbidities (includes updates in patient status) 1 10 X- 1 5 Reassessment of Adherence to Treatment Plan ASSESSMENTS - Wound and Skin Assessment / Reassessment X - Simple Wound Assessment / Reassessment - one wound 1 5 []  - 0 Complex Wound Assessment / Reassessment - multiple wounds []  - 0 Dermatologic / Skin Assessment (not related to wound area) ASSESSMENTS - Focused Assessment []  - Circumferential Edema Measurements - multi extremities 0 []  - 0 Nutritional Assessment / Counseling / Intervention X- 1 5 Lower Extremity Assessment (monofilament, tuning fork, pulses) []  - 0 Peripheral Arterial Disease Assessment (using hand held doppler) ASSESSMENTS - Ostomy and/or Continence Assessment and Care []  - Incontinence Assessment and Management 0 []  - 0 Ostomy Care Assessment and Management (repouching, etc.) PROCESS - Coordination of Care X - Simple Patient / Family Education for ongoing care 1 15 []  - 0 Complex (extensive) Patient / Family Education for ongoing care X- 1 10 Staff obtains ChiropractorConsents, Records, Test Results / Process Orders []  - 0 Staff telephones HHA, Nursing Homes / Clarify orders / etc []  - 0 Routine Transfer to another Facility (non-emergent condition) []  - 0 Routine Hospital Admission (non-emergent condition) []  - 0 New Admissions / Manufacturing engineernsurance Authorizations / Ordering NPWT, Apligraf,  etc. []  - 0 Emergency Hospital Admission (emergent condition) X- 1 10 Simple Discharge Coordination [name], [name] ([id number]) []  - 0 Complex (extensive) Discharge Coordination PROCESS - Special Needs []  - Pediatric / Minor Patient Management 0 []  - 0 Isolation Patient Management []  - 0 Hearing / Language / Visual special needs []  -  0 Assessment of Community assistance (transportation, D/C planning, etc.) []  - 0 Additional assistance / Altered mentation []  - 0 Support Surface(s) Assessment (bed, cushion, seat, etc.) INTERVENTIONS - Wound Cleansing / Measurement X - Simple Wound Cleansing - one wound 1 5 []  - 0 Complex Wound Cleansing - multiple wounds X- 1 5 Wound Imaging (photographs - any number of wounds) []  - 0 Wound Tracing (instead of photographs) X- 1 5 Simple Wound Measurement - one wound []  - 0 Complex Wound Measurement - multiple wounds INTERVENTIONS - Wound Dressings X - Small Wound Dressing one or multiple wounds 1 10 []  - 0 Medium Wound Dressing one or multiple wounds []  - 0 Large Wound Dressing one or multiple wounds []  - 0 Application of Medications - topical []  - 0 Application of Medications - injection INTERVENTIONS - Miscellaneous []  - External ear exam 0 X- 1 5 Specimen Collection (cultures, biopsies, blood, body fluids, etc.) X- 1 5 Specimen(s) / Culture(s) sent or taken to Lab for analysis []  - 0 Patient Transfer (multiple staff / Civil Service fast streamer / Similar devices) []  - 0 Simple Staple / Suture removal (25 or less) []  - 0 Complex Staple / Suture removal (26 or more) []  - 0 Hypo / Hyperglycemic Management (close monitor of Blood Glucose) []  - 0 Ankle / Brachial Index (ABI) - do not check if billed separately X- 1 5 Vital Signs [name], [name] ([id number]) Has the patient been seen at the hospital within the last three years: Yes Total Score: 100 Level Of Care: New/Established - Level 3 Electronic Signature(s) Signed: 10/20/2018  4:09:36 PM By: Montey Hora Entered By: Montey Hora on 10/20/2018 09:24:46 [name], [name] ([id number]) -------------------------------------------------------------------------------- Encounter Discharge Information Details Patient Name: Shiley, Attikus A. Date of Service: 10/20/2018 8:30 AM Medical Record Number: [id number] Patient Account Number: [id number] Date of Birth/Sex: [birth year] (43 y.o. Male) Treating RN: Montey Hora Primary Care Brantleigh Mifflin: Pernell Dupre Other Clinician: Referring Kamen Hanken: Pernell Dupre Treating Nels Munn/Extender: Melburn Hake, HOYT Weeks in Treatment: 4 Encounter Discharge Information Items Schedule Follow-up Appointment: No Clinical Summary of Care: Provided Form Type Recipient Paper Patient tr Electronic Signature(s) Signed: 10/20/2018 9:31:12 AM By: Lorine Bears RCP, RRT, CHT Entered By: Lorine Bears on 10/20/2018 09:31:12 [name], [name] ([id number]) -------------------------------------------------------------------------------- Lower Extremity Assessment Details Patient Name: Vantuyl, Eivan A. Date of Service: 10/20/2018 8:30 AM Medical Record Number: [id number] Patient Account Number: [id number] Date of Birth/Sex: January 25, 1976 (43 y.o. Male) Treating RN: Army Melia Primary Care Lakenya Riendeau: Pernell Dupre Other Clinician: Referring Jerrol Helmers: Pernell Dupre Treating Ranessa Kosta/Extender: STONE III, HOYT Weeks in Treatment: 4 Edema Assessment Assessed: [Left: No] [Right: No] Edema: [Left: N] [Right: o] Vascular Assessment Pulses: Dorsalis Pedis Palpable: [Left:Yes] Electronic Signature(s) Signed: 10/20/2018 11:47:58 AM By: Army Melia Entered By: Army Melia on 10/20/2018 08:41:32 [name], [name] ([id number]) -------------------------------------------------------------------------------- Multi Wound Chart Details Patient Name: [name], [name] Date of Service: 10/20/2018 8:30  AM Medical Record Number: [id number] Patient Account Number: [id number] Date of Birth/Sex: 26-Aug-1975 (43 y.o. Male) Treating RN: Montey Hora Primary Care Glynda Soliday: Pernell Dupre Other Clinician: Referring Rondel Episcopo: Pernell Dupre Treating Treydon Henricks/Extender: Melburn Hake, HOYT Weeks in Treatment: 4 Vital Signs Height(in): 69 Pulse(bpm): 83 Weight(lbs): 230 Blood Pressure(mmHg): 145/93 Body Mass Index(BMI): 34 Temperature(F): 98.0 Respiratory Rate 16 (breaths/min): Photos: [N/A:N/A] Wound Location: Left Foot - Plantar N/A N/A Wounding Event: Blister N/A N/A Primary Etiology: Diabetic Wound/Ulcer of the N/A N/A Lower Extremity Comorbid History: Hypertension, Type II Diabetes N/A N/A Date Acquired: 05/28/2018 N/A N/A Weeks of Treatment:  4 N/A N/A Wound Status: Open N/A N/A Measurements L x W x D 2.4x1.7x0.9 N/A N/A (cm) Area (cm) : 3.204 N/A N/A Volume (cm) : 2.884 N/A N/A % Reduction in Area: 5.60% N/A N/A % Reduction in Volume: 55.30% N/A N/A Classification: Grade 2 N/A N/A Exudate Amount: Medium N/A N/A Exudate Type: Serous N/A N/A Exudate Color: amber N/A N/A Wound Margin: Well defined, not attached N/A N/A Granulation Amount: Medium (34-66%) N/A N/A Granulation Quality: Red, Hyper-granulation N/A N/A Necrotic Amount: Small (1-33%) N/A N/A Necrotic Tissue: Eschar, Adherent Slough N/A N/A Exposed Structures: Fat Layer (Subcutaneous N/A N/A Tissue) Exposed: Yes Fascia: No Tendon: No Muscle: No Marmol, Blaiden A. ([id number]) Joint: No Bone: No Epithelialization: None N/A N/A Treatment Notes Electronic Signature(s) Signed: 10/20/2018 4:09:36 PM By: Curtis Sitesorthy, Joanna Entered By: Curtis Sitesorthy, Joanna on 10/20/2018 09:10:23 Jeppsen, Leovardo AMarland Kitchen. ([id number]) -------------------------------------------------------------------------------- Multi-Disciplinary Care Plan Details Patient Name: Lesperance, Troy A. Date of Service: 10/20/2018 8:30 AM Medical Record Number:  [id number] Patient Account Number: [id number] Date of Birth/Sex: July 22, 1975 (43 y.o. Male) Treating RN: Curtis Sitesorthy, Joanna Primary Care Adali Pennings: Bluford Mainejan-Sie, Sheikh Other Clinician: Referring Janay Canan: Bluford Mainejan-Sie, Sheikh Treating Melaina Howerton/Extender: Linwood DibblesSTONE III, HOYT Weeks in Treatment: 4 Active Inactive Pressure Nursing Diagnoses: Knowledge deficit related to causes and risk factors for pressure ulcer development Knowledge deficit related to management of pressures ulcers Goals: Patient/caregiver will verbalize risk factors for pressure ulcer development Date Initiated: 09/22/2018 Target Resolution Date: 10/23/2018 Goal Status: Active Interventions: Assess: immobility, friction, shearing, incontinence upon admission and as needed Assess offloading mechanisms upon admission and as needed Assess potential for pressure ulcer upon admission and as needed Provide education on pressure ulcers Notes: Wound/Skin Impairment Nursing Diagnoses: Impaired tissue integrity Goals: Ulcer/skin breakdown will have a volume reduction of 30% by week 4 Date Initiated: 09/22/2018 Target Resolution Date: 10/23/2018 Goal Status: Active Interventions: Assess patient/caregiver ability to obtain necessary supplies Assess patient/caregiver ability to perform ulcer/skin care regimen upon admission and as needed Assess ulceration(s) every visit Provide education on ulcer and skin care Notes: Electronic Signature(s) Signed: 10/20/2018 4:09:36 PM By: Curtis Sitesorthy, Joanna Entered By: Curtis Sitesorthy, Joanna on 10/20/2018 09:10:14 [name], [name] ([id number]) [name], [name] ([id number]) -------------------------------------------------------------------------------- Pain Assessment Details Patient Name: [name], [name] Date of Service: 10/20/2018 8:30 AM Medical Record Number: [id number] Patient Account Number: [id number] Date of Birth/Sex: July 22, 1975 (43 y.o. Male) Treating RN: Rodell PernaScott, Dajea Primary Care Jaleah Lefevre:  Bluford Mainejan-Sie, Sheikh Other Clinician: Referring Breylan Lefevers: Bluford Mainejan-Sie, Sheikh Treating Yevette Knust/Extender: Linwood DibblesSTONE III, HOYT Weeks in Treatment: 4 Active Problems Location of Pain Severity and Description of Pain Patient Has Paino No Site Locations Pain Management and Medication Current Pain Management: Electronic Signature(s) Signed: 10/20/2018 11:47:58 AM By: Rodell PernaScott, Dajea Entered By: Rodell PernaScott, Dajea on 10/20/2018 08:35:57 [name], Saksham AMarland Kitchen. ([id number]) -------------------------------------------------------------------------------- Patient/Caregiver Education Details Patient Name: [name], Morty A. Date of Service: 10/20/2018 8:30 AM Medical Record Number: [id number] Patient Account Number: [id number] Date of Birth/Gender: July 22, 1975 (43 y.o. Male) Treating RN: Curtis Sitesorthy, Joanna Primary Care Physician: Bluford Mainejan-Sie, Sheikh Other Clinician: Referring Physician: Bluford Mainejan-Sie, Sheikh Treating Physician/Extender: Skeet SimmerSTONE III, HOYT Weeks in Treatment: 4 Education Assessment Education Provided To: Patient Education Topics Provided Wound/Skin Impairment: Handouts: Other: wound care as ordered Methods: Demonstration, Explain/Verbal Responses: State content correctly Electronic Signature(s) Signed: 10/20/2018 4:09:36 PM By: Curtis Sitesorthy, Joanna Entered By: Curtis Sitesorthy, Joanna on 10/20/2018 09:25:13 Baquera, Reyden A. ([id number]) -------------------------------------------------------------------------------- Wound Assessment Details Patient Name: Delmonaco, Rey A. Date of Service: 10/20/2018 8:30 AM Medical Record Number: [id number] Patient Account Number: [id number] Date of Birth/Sex: July 22, 1975 (43 y.o. Male) Treating RN: Rodell PernaScott, Dajea  Primary Care Uriel Horkey: Bluford Mainejan-Sie, Sheikh Other Clinician: Referring Jerris Fleer: Bluford Mainejan-Sie, Sheikh Treating Saphronia Ozdemir/Extender: Linwood DibblesSTONE III, HOYT Weeks in Treatment: 4 Wound Status Wound Number: 1 Primary Etiology: Diabetic Wound/Ulcer of the Lower Extremity Wound Location: Left  Foot - Plantar Wound Status: Open Wounding Event: Blister Comorbid Hypertension, Type II Diabetes Date Acquired: 05/28/2018 History: Weeks Of Treatment: 4 Clustered Wound: No Photos Wound Measurements Length: (cm) 2.4 Width: (cm) 1.7 Depth: (cm) 0.9 Area: (cm) 3.204 Volume: (cm) 2.884 % Reduction in Area: 5.6% % Reduction in Volume: 55.3% Epithelialization: None Tunneling: No Undermining: No Wound Description Classification: Grade 2 Foul Odor A Wound Margin: Well defined, not attached Slough/Fibr Exudate Amount: Medium Exudate Type: Serous Exudate Color: amber fter Cleansing: No ino Yes Wound Bed Granulation Amount: Medium (34-66%) Exposed Structure Granulation Quality: Red, Hyper-granulation Fascia Exposed: No Necrotic Amount: Small (1-33%) Fat Layer (Subcutaneous Tissue) Exposed: Yes Necrotic Quality: Eschar, Adherent Slough Tendon Exposed: No Muscle Exposed: No Joint Exposed: No Bone Exposed: No Treatment Notes Yaun, Jaykwon A. ([id number]) Wound #1 (Left, Plantar Foot) Notes SIlver alginate, gauze, conform, coban Electronic Signature(s) Signed: 10/20/2018 11:47:58 AM By: Rodell PernaScott, Dajea Entered By: Rodell PernaScott, Dajea on 10/20/2018 08:39:50 Sporer, Kahmari A. ([id number]) -------------------------------------------------------------------------------- Vitals Details Patient Name: Noyes, Jaevon A. Date of Service: 10/20/2018 8:30 AM Medical Record Number: [id number] Patient Account Number: [id number] Date of Birth/Sex: 09-Sep-1975 (43 y.o. Male) Treating RN: Rodell PernaScott, Dajea Primary Care Wakisha Alberts: Bluford Mainejan-Sie, Sheikh Other Clinician: Referring Daiwik Buffalo: Bluford Mainejan-Sie, Sheikh Treating Francia Verry/Extender: Linwood DibblesSTONE III, HOYT Weeks in Treatment: 4 Vital Signs Time Taken: 08:36 Temperature (F): 98.0 Height (in): 69 Pulse (bpm): 83 Weight (lbs): 230 Respiratory Rate (breaths/min): 16 Body Mass Index (BMI): 34 Blood Pressure (mmHg): 145/93 Reference Range: 80 - 120 mg /  dl Electronic Signature(s) Signed: 10/20/2018 11:47:58 AM By: Rodell PernaScott, Dajea Entered By: Rodell PernaScott, Dajea on 10/20/2018 08:36:17

## 2018-10-20 NOTE — Progress Notes (Addendum)
Tyrone DoryRICHMOND, [name] ([id number]) Visit Report for 10/20/2018 Chief Complaint Document Details Patient Name: [name], [name] Date of Service: 10/20/2018 8:30 AM Medical Record Number: [id number] Patient Account Number: [id number] Date of Birth/Sex: January 20, 1976 (43 y.o. M) Treating RN: [name], [name] Primary Care Provider: Bluford [name], [name] Other Clinician: Referring Provider: Bluford [name], [name] Treating Provider/Extender: [name], [name] Weeks in Treatment: 4 Information Obtained from: Patient Chief Complaint Left plantar foot ulcer Electronic Signature(s) Signed: 10/20/2018 4:51:26 PM By: [name], Hoyt PA-C Entered By: [name], [name] on 10/20/2018 08:27:39 [name], [name] ([id number]) -------------------------------------------------------------------------------- HPI Details Patient Name: [name], [name] Date of Service: 10/20/2018 8:30 AM Medical Record Number: [id number] Patient Account Number: [id number] Date of Birth/Sex: January 20, 1976 (43 y.o. M) Treating RN: [name], [name] Primary Care Provider: Bluford [name], [name] Other Clinician: Referring Provider: Bluford [name], [name] Treating Provider/Extender: [name], [name] Weeks in Treatment: 4 History of Present Illness HPI Description: 09/22/18 on evaluation today patient presents for initial evaluation here in our clinic regarding issues that he has been having with his left plantar foot at the base of the second toe. He tells me that this has been present since January 2020. Subsequently she was seen by podiatry where he did have an x-ray of Aug 18, 2018. A culture was also obtained. The culture showed that he had group B strep noted but no other abnormal findings. He's been on doxycycline since that time. He is still doxycycline currently in fact is picking up another prescription today. Fortunately there's no signs of systemic infection. 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 Last time you seen vascular as best I can tell was in 2017. This in fact that was on 05/27/15. At that point he had no evidence of hemodynamically significant inflow extraction. Overall things seem to be doing well which is excellent news. He hasn't however seen anyone since that time that has been about three years therefore I think it may be beneficial for him to go back to [name] them. Especially in light of having the ulceration which again if he doesn't have sufficient flow he likely is not going to heal appropriately. His ABI today was 0.93 and seems to be okay hopefully this is an indication that things are doing well. Secondarily I do think is gonna require an MRI of the left foot I'm concerned about the fact that he has bone involvement right now and likely may have a bone infection. He is on the doxycycline which is good news but again the wound has not been improving according to what he tells me. 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 7/1; patient admitted to our clinic last week. We still have not managed to arrange an [facility] Our staff called the Medicaid office to press for an urgent processing. He is using silver alginate. He has a Darco forefoot off loader 10/09/18-Patient returns at 2 weeks to clinic has been on doxycycline, insurance is denying MRI authorization, we are using silver cell dressing and Darco forefoot off loader not much change to the wound so far 10/20/18 on evaluation today patient is seen for reevaluation regarding his plantar foot wound that really does not seem to be significantly different from what it was last time I saw him. Has been several weeks back. With that being said he did [name]  Dr. the very last time he was here he was placed on doxycycline at that point. Unfortunately there wasn't error with a prescription only  recommending 10 pills when it should've been 20 and therefore it sounds like this never got feel the pharmacy did not contact us and might've been some confusion with were Dr. Ericka [name] practices. Nonetheless the patient did not contact the office we have not been able to rectify this I will do so today. He did have a CT scan which revealed no obvious signs of osteomyelitis or septic arthritis nor abscess. There was however evidence of cellulitis noted on the scan. Other than this the patient states that he seems to be doing okay he still having a lot of drainage at this point. Electronic Signature(s) Signed: 10/20/2018 4:51:26 PM By: Tyrone Kelp PA-C Entered By: Tyrone Kelp on 10/20/2018 09:19:26 [name], [name] Kitchen ([id number]) -------------------------------------------------------------------------------- Physical Exam Details Patient Name: [name], [name] Date of Service: 10/20/2018 8:30 AM Medical Record Number: [id number] Patient Account Number: [id number] Date of Birth/Sex: Mar 24, 1976 (43 y.o. M) Treating RN: Tyrone Sites Primary Care Provider: Bluford Main Other Clinician: Referring Provider: Bluford Main Treating Provider/Extender: Tyrone Dibbles, [name] Weeks in Treatment: 4 Constitutional Well-nourished and well-hydrated in no acute distress. Respiratory normal breathing without difficulty. clear to auscultation bilaterally. Cardiovascular regular rate and rhythm with normal S1, S2. Psychiatric this patient is able to make decisions and demonstrates good insight into disease process. Alert and Oriented x 3. pleasant and cooperative. Notes Patient's wound bed currently appear to have fairly good granulation was some Slough buildup on the surface of the wound this was minimal more biofilm in nature. Nonetheless I do think debridement could potentially stimulate and help this one to make some progress. With that being said were still waiting on the vascular  evaluation which is actually scheduled for 6 August before proceeding down this road. Electronic Signature(s) Signed: 10/20/2018 4:51:26 PM By: Tyrone Kelp PA-C Entered By: Tyrone Kelp on 10/20/2018 09:20:02 Penn, Terisa Starr ([id number]) -------------------------------------------------------------------------------- Physician Orders Details Patient Name: [name], [name] Date of Service: 10/20/2018 8:30 AM Medical Record Number: [id number] Patient Account Number: [id number] Date of Birth/Sex: [birth year] (43 y.o. 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 Primary Wound Dressing Wound #1 Left,Plantar Foot o Silver Alginate Secondary Dressing Wound #1 Left,Plantar Foot o Dry Gauze o Conform/Kerlix o Coban Dressing Change Frequency Wound #1 Left,Plantar Foot o Change dressing every other day. Follow-up Appointments Wound #1 Left,Plantar Foot o Return Appointment in 1 week. Off-Loading Wound #1 Left,Plantar Foot o Other: - Cam walker boot Laboratory [place], Bradrick A. ([id number]) o Bacteria identified in Wound by Culture (MICRO) oooo LOINC  Code: 6462-6 oooo Convenience Name: Wound culture routine Patient Medications Allergies: No Known Drug Allergies Notifications Medication Indication Start End doxycycline hyclate 10/20/2018  DOSE 1 - oral 100 mg capsule - 1 capsule oral taken 2 times a day for 14 days clindamycin HCl 10/24/2018 DOSE 1 - oral 300 mg capsule - 1 capsule oral taken by mouth 3 times a day for 14 days Electronic Signature(s) Signed: 10/24/2018 4:57:20 PM By: Tyrone Kelp PA-C Previous Signature: 10/20/2018 4:09:36 PM Version By: Tyrone Sites Previous Signature: 10/20/2018 4:51:26 PM Version By: Tyrone Kelp PA-C Previous Signature: 10/20/2018 9:21:15 AM Version By: Tyrone Kelp PA-C Entered By: Tyrone Kelp on 10/24/2018 16:57:20 [name], [name] ([id number]) -------------------------------------------------------------------------------- Problem List Details Patient Name: [name], [name] Date of Service: 10/20/2018 8:30 AM Medical Record Number: [id number] Patient Account Number: [id number] Date of Birth/Sex: May 17, 1975 (43 y.o. 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  ([id number]) -------------------------------------------------------------------------------- Progress Note Details Patient Name: [name], [name] Date of Service: 10/20/2018 8:30 AM Medical Record Number: [id number] Patient Account Number: [id number] Date of Birth/Sex: 30-Aug-1975 (43 y.o. M) Treating RN: Tyrone Sites Primary Care Provider: Bluford Main Other Clinician: Referring Provider: Bluford Main Treating Provider/Extender: Tyrone Dibbles, [name] Weeks in Treatment: 4 Subjective Chief Complaint Information obtained from Patient Left plantar foot ulcer History of Present Illness (HPI) 09/22/18 on evaluation today patient presents for initial evaluation here in our clinic regarding issues that he has been having with his left plantar foot at the base of the second toe. He tells me that this has been present since January 2020. Subsequently she was seen by podiatry where he did have an x-ray of Aug 18, 2018. A culture was also obtained. The culture showed that he had group B strep noted but no other abnormal findings. He's been on doxycycline since that time. He is still doxycycline currently in fact is picking up another prescription today. Fortunately there's no signs of systemic infection. 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 Last time you seen vascular as best I can tell was in 2017. This in fact that was on 05/27/15. At that point he had no evidence of hemodynamically significant inflow extraction. Overall things seem to be doing well which is excellent news. He hasn't however seen anyone since that time that has been about three years therefore I think it may be beneficial for him to go back to [name] them. Especially in light of having the ulceration which again if he doesn't have sufficient flow he likely is not going to  heal appropriately. His ABI today was 0.93 and seems to be okay hopefully this is an indication that things are doing well. Secondarily I do think is gonna require an MRI of the left foot I'm concerned about the fact that he has bone involvement right now and  likely may have a bone infection. He is on the doxycycline which is good news but again the wound has not been improving according to what he tells me. 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 7/1; patient admitted to our clinic last week. We still have not managed to arrange an MRI as it is in the [facility] approval phase. Our staff called the Medicaid office to press for an urgent processing. He is using silver alginate. He has a Darco forefoot off loader 10/09/18-Patient returns at 2 weeks to clinic has been on doxycycline, insurance is denying MRI authorization, we are using silver cell dressing and Darco forefoot off loader not much change to the wound so far 10/20/18 on evaluation today patient is seen for reevaluation regarding his plantar foot wound that really does not seem to be significantly different from what it was last time I saw him. Has been several weeks back. With that being said he did [name] Dr. the very last time he was here he was placed on doxycycline at that point. Unfortunately there wasn't error with a prescription only recommending 10 pills when it should've been 20 and therefore it sounds like this never got feel the pharmacy did not contact us and might've been some confusion with were Dr. Ericka [name] practices. Nonetheless the patient did not contact the office we have not been able to rectify this I will do so today. He did have a CT scan which revealed no obvious signs of osteomyelitis or septic arthritis nor abscess. There was however evidence of cellulitis noted on the scan. Other than this the patient states that he seems to be doing okay he still having a lot of drainage at this point. Patient History Information  obtained from Patient. Allergies No Known Drug Allergies Family History Luton, Elizeo A. ([id number]) Cancer - Father, Diabetes - Father,Mother, Hypertension - Father, Lung Disease - Father, Stroke - Father, Thyroid Problems - Mother, No family history of Heart Disease, Hereditary Spherocytosis, Kidney Disease, Seizures, Tuberculosis. Social History Current every day smoker - 1/2 pack day, Marital Status - Single, Alcohol Use - Rarely, Drug Use - No History, Caffeine Use - Daily - soda. Medical History Eyes Denies history of Cataracts, Glaucoma, Optic Neuritis Ear/Nose/Mouth/Throat Denies history of Chronic sinus problems/congestion, Middle ear problems Hematologic/Lymphatic Denies history of Anemia, Hemophilia, Human Immunodeficiency Virus, Lymphedema, Sickle Cell Disease Respiratory Denies history of Aspiration, Asthma, Chronic Obstructive Pulmonary Disease (COPD), Pneumothorax, Sleep Apnea, Tuberculosis Cardiovascular Patient has history of Hypertension Denies history of Angina, Arrhythmia, Congestive Heart Failure, Coronary Artery Disease, Deep Vein Thrombosis, Hypotension, Myocardial Infarction, Peripheral Arterial Disease, Peripheral Venous Disease, Phlebitis, Vasculitis Gastrointestinal Denies history of Cirrhosis , Colitis, Crohn s, Hepatitis A, Hepatitis B, Hepatitis C Endocrine Patient has history of Type II Diabetes Genitourinary Denies history of End Stage Renal Disease Immunological Denies history of Lupus Erythematosus, Raynaud s, Scleroderma Integumentary (Skin) Denies history of History of Burn, History of pressure wounds Musculoskeletal Denies history of Gout, Rheumatoid Arthritis, Osteoarthritis, Osteomyelitis Neurologic Denies history of Dementia, Neuropathy, Quadriplegia, Paraplegia, Seizure Disorder Oncologic Denies history of Received Chemotherapy, Received Radiation Psychiatric Denies history of Anorexia/bulimia, Confinement Anxiety Review of  Systems (ROS) Constitutional Symptoms (General Health) Denies complaints or symptoms of Fatigue, Fever, Chills, Marked Weight Change. Respiratory Denies complaints or symptoms of Chronic or frequent coughs, Shortness of Breath. Cardiovascular Denies complaints or symptoms of Chest pain, LE edema. Psychiatric Denies complaints or symptoms of Anxiety, Claustrophobia. Blackstock, Randle A. ([id number]) Objective Constitutional Well-nourished and well-hydrated in no  acute distress. Vitals Time Taken: 8:36 AM, Height: 69 in, Weight: 230 lbs, BMI: 34, Temperature: 98.0 F, Pulse: 83 bpm, Respiratory Rate: 16 breaths/min, Blood Pressure: 145/93 mmHg. Respiratory normal breathing without difficulty. clear to auscultation bilaterally. Cardiovascular regular rate and rhythm with normal S1, S2. Psychiatric this patient is able to make decisions and demonstrates good insight into disease process. Alert and Oriented x 3. pleasant and cooperative. General Notes: Patient's wound bed currently appear to have fairly good granulation was some Slough buildup on the surface of the wound this was minimal more biofilm in nature. Nonetheless I do think debridement could potentially stimulate and help this one to make some progress. With that being said were still waiting on the vascular evaluation which is actually scheduled for 6 August before proceeding down this road. Integumentary (Hair, Skin) Wound #1 status is Open. Original cause of wound was Blister. The wound is located on the Left,Plantar Foot. The wound measures 2.4cm length x 1.7cm width x 0.9cm depth; 3.204cm^2 area and 2.884cm^3 volume. There is Fat Layer (Subcutaneous Tissue) Exposed exposed. There is no tunneling or undermining noted. There is a medium amount of serous drainage noted. The wound margin is well defined and not attached to the wound base. There is medium (34-66%) red, hyper - granulation within the wound bed. There is a small  (1-33%) amount of necrotic tissue within the wound bed including Eschar and Adherent Slough. 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 ([id number]) Wound #1 Left,Plantar Foot: Topical Lidocaine 4% cream applied to wound bed prior to debridement (In Clinic Only). Primary Wound Dressing: Wound #1 Left,Plantar Foot: Silver Alginate Secondary Dressing: Wound #1 Left,Plantar Foot: Dry Gauze Conform/Kerlix Coban Dressing Change Frequency: Wound #1 Left,Plantar Foot: Change dressing every other day. Follow-up Appointments: Wound #1 Left,Plantar Foot: Return Appointment in 1 week. Off-Loading: Wound #1 Left,Plantar Foot: Other: - Cam walker boot Laboratory ordered were: Wound culture routine The following medication(s) was prescribed: doxycycline hyclate oral 100 mg capsule 1 1 capsule oral taken 2 times a day for 14 days starting 10/20/2018 clindamycin HCl oral 300 mg capsule 1 1 capsule oral taken by mouth 3 times a day for 14 days starting 10/24/2018 Currently my suggestion for the patient was that we go ahead and prescribed the doxycycline I did go ahead and continue wound culture as well from the deep wound area that extends down to his bone although there is barely anything that I can touch there's definitely not enough to be able to get a piece for biopsy and culture. Nonetheless I'm gonna suggest that we go ahead and continue with the Current wound care measures I will go ahead and send in to the doxycycline for him to  the pharmacy. If anything changes worsens meantime you let me know if we need to make any adjustments to his antibiotic regimen once we get the culture results back I will do so that time. He's in agreement with plan. We will otherwise [name] him back for reevaluation in one weeks time. Please [name] above for specific wound care orders. We will [name] patient for re-evaluation in 1  week(s) here in the clinic. If anything worsens or changes patient will contact our office for additional recommendations. Patient's culture showed resistance doxycycline. Therefore I did send in a prescription for clindamycin which the methicillin- resistant Staphylococcus aureus is sensitive to. He will be contacted tomorrow mowing by nursing staff to let him know about this change in medication. Electronic Signature(s) Signed: 10/29/2018 6:04:32 PM By: [name], Hoyt PA-C Previous Signature: 10/20/2018 4:51:26 PM Version By: [name], Hoyt PA-C Entered By: [name], [name] on 10/24/2018 16:57:46 [name], [name] ([id number]) -------------------------------------------------------------------------------- ROS/PFSH Details Patient Name: [name], [name] Date of Service: 10/20/2018 8:30 AM Medical Record Number: [id number] Patient Account Number: [id number] Date of Birth/Sex: 1976/01/02 (43 y.o. M) Treating RN: [name], [name] Primary Care Provider: Bluford [name], [name] Other Clinician: Referring Provider: Bluford [name], [name] Treating Provider/Extender: [name], [name] Weeks in Treatment: 4 Information Obtained From Patient Constitutional Symptoms (General Health) Complaints and Symptoms: Negative for: Fatigue; Fever; Chills; Marked Weight Change Respiratory Complaints and Symptoms: Negative for: Chronic or frequent coughs; Shortness of Breath Medical History: Negative for: Aspiration; Asthma; Chronic Obstructive Pulmonary Disease (COPD); Pneumothorax; Sleep Apnea; Tuberculosis Cardiovascular Complaints and  Symptoms: Negative for: Chest pain; LE edema Medical History: Positive for: Hypertension Negative for: Angina; Arrhythmia; Congestive Heart Failure; Coronary Artery Disease; Deep Vein Thrombosis; Hypotension; Myocardial Infarction; Peripheral Arterial Disease; Peripheral Venous Disease; Phlebitis; Vasculitis Psychiatric Complaints and Symptoms: Negative for: Anxiety; Claustrophobia Medical History: Negative for: Anorexia/bulimia; Confinement Anxiety Eyes Medical History: Negative for: Cataracts; Glaucoma; Optic Neuritis Ear/Nose/Mouth/Throat Medical History: Negative for: Chronic sinus problems/congestion; Middle ear problems Hematologic/Lymphatic Medical History: Negative for: Anemia; Hemophilia; Human Immunodeficiency Virus; Lymphedema; Sickle Cell Disease [name], [name] ([id number]) Gastrointestinal Medical History: Negative for: Cirrhosis ; Colitis; Crohnos; Hepatitis A; Hepatitis B; Hepatitis C Endocrine Medical History: Positive for: Type II Diabetes Time with diabetes: 2 years Treated with: Insulin, Oral agents Genitourinary Medical History: Negative for: End Stage Renal Disease Immunological Medical History: Negative for: Lupus Erythematosus; Raynaudos; Scleroderma Integumentary (Skin) Medical History: Negative for: History of Burn; History of pressure wounds Musculoskeletal Medical History: Negative for: Gout; Rheumatoid Arthritis; Osteoarthritis; Osteomyelitis Neurologic Medical History: Negative for: Dementia; Neuropathy; Quadriplegia; Paraplegia; Seizure Disorder Oncologic Medical History: Negative for: Received Chemotherapy; Received Radiation Immunizations Pneumococcal Vaccine: Received Pneumococcal Vaccination: No Implantable Devices None Family and Social History Cancer: Yes - Father; Diabetes: Yes - Father,Mother; Heart Disease: No; Hereditary Spherocytosis: No; Hypertension: Yes - Father; Kidney Disease: No; Lung Disease: Yes - Father;  Seizures: No; Stroke: Yes - Father; Thyroid Problems: Yes - Mother; Tuberculosis: No; Current every day smoker - 1/2 pack day; Marital Status - Single; Alcohol Use: Rarely; Drug Use: No History; Caffeine Use: Daily - soda; Financial Concerns: No; Food, Clothing or Shelter Needs: No; Support System Lacking: No; Transportation Concerns: No Physician Affirmation I have reviewed and agree with the above information. Tyrone DoryRICHMOND, Therron A. ([id number]) Electronic Signature(s) Signed: 10/20/2018 4:09:36 PM By: [name], [name] Signed: 10/20/2018 4:51:26 PM By: [name], Hoyt PA-C Entered By: [name], [name] on 10/20/2018 09:19:41 Ladley, Eddrick A. ([id number]) -------------------------------------------------------------------------------- SuperBill Details Patient Name: Buccheri, Lakeith A. Date of Service: 10/20/2018 Medical Record Number: [id number] Patient Account Number: [id number] Date of Birth/Sex: 1976/01/02 (43 y.o. 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 E11.40 Type 2 diabetes mellitus with diabetic neuropathy, unspecified Facility Procedures CPT4 Code: 37342876 Description: 99213 - WOUND CARE VISIT-LEV 3 EST PT Modifier: Quantity: 1 Physician Procedures CPT4: Description Modifier Quantity Code 8115726 20355 - WC PHYS LEVEL 4 - EST PT 1 ICD-10 Diagnosis Description E11.621 Type 2 diabetes mellitus with foot ulcer L97.522 Non-pressure chronic ulcer of other part of left foot with fat layer exposed I70.312  Atherosclerosis of unspecified  type of 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

## 2018-10-23 LAB — AEROBIC CULTURE W GRAM STAIN (SUPERFICIAL SPECIMEN): Gram Stain: NONE SEEN

## 2018-10-27 ENCOUNTER — Encounter: Payer: Medicaid Other | Admitting: Physician Assistant

## 2018-10-27 ENCOUNTER — Other Ambulatory Visit: Payer: Self-pay

## 2018-10-27 DIAGNOSIS — E11621 Type 2 diabetes mellitus with foot ulcer: Secondary | ICD-10-CM | POA: Diagnosis not present

## 2018-10-27 NOTE — Progress Notes (Addendum)
Tyrone DoryRICHMOND, [name]. ([id number]) Visit Report for 10/27/2018 Arrival Information Details Patient Name: [name], [name]. Date of Service: 10/27/2018 9:15 AM Medical Record Number: [id number] Patient Account Number: [id number] Date of Birth/Sex: [birth year] (43 y.o. M) Treating RN: [name] Primary Care [place] Sites: Bluford [name], Tyrone Other Clinician: Referring [name]: Bluford [name], Tyrone Treating Letcher Schweikert/Extender: [name] Weeks in Treatment: 5 Visit Information History Since Last Visit Added or deleted any medications: No Patient Arrived: Ambulatory Any new allergies or adverse reactions: No Arrival Time: 09:18 Had [name] fall or experienced change in No Accompanied By: self activities of daily living that may affect Transfer Assistance: None risk of falls: Patient Identification Verified: Yes Signs or symptoms of abuse/neglect since last visito No Secondary Verification Process Completed: Yes Hospitalized since last visit: No Has Dressing in Place as Prescribed: Yes Pain Present Now: Yes Electronic Signature(s) Signed: 10/27/2018 4:25:02 PM By: [name] Entered By: [name] on 10/27/2018 09:19:23 [name], Tyrone AMarland Kitchen. ([id number]) -------------------------------------------------------------------------------- Encounter Discharge Information Details Patient Name: [name], [name]. Date of Service: 10/27/2018 9:15 AM Medical Record Number: [id number] Patient Account Number: [id number] Date of Birth/Sex: [birth year] (43 y.o. M) Treating RN: [name] Primary Care Deicy Rusk: Bluford [name], Tyrone Other Clinician: Referring [name]: Bluford [name], Tyrone Treating [name]/Extender: [name] Weeks in Treatment: 5 Encounter Discharge Information Items Post Procedure Vitals Discharge Condition: Stable Temperature (F): 98.6 Ambulatory Status: Ambulatory Pulse (bpm): 67 Discharge Destination: Home Respiratory Rate (breaths/min): 16 Transportation: Private  Auto Blood Pressure (mmHg): 161/90 Accompanied By: self Schedule Follow-up Appointment: Yes Clinical Summary of Care: Electronic Signature(s) Signed: 10/27/2018 4:33:47 PM By: [name] Entered By: [name] on 10/27/2018 09:48:23 [name]. ([id number]) -------------------------------------------------------------------------------- Lower Extremity Assessment Details Patient Name: [name], [name]. Date of Service: 10/27/2018 9:15 AM Medical Record Number: [id number] Patient Account Number: [id number] Date of Birth/Sex: [birth year] (43 y.o. M) Treating RN: [name] Primary Care Jolana Runkles: Bluford [name], Tyrone Other Clinician: Referring Sheriece Jefcoat: Bluford [name], Tyrone Treating [name]/Extender: [name] Weeks in Treatment: 5 Vascular Assessment Pulses: Dorsalis Pedis Palpable: [Left:Yes] Posterior Tibial Palpable: [Left:Yes] Electronic Signature(s) Signed: 10/27/2018 4:25:02 PM By: [name] Entered By: [name] on 10/27/2018 09:27:10 [name]. ([id number]) -------------------------------------------------------------------------------- Multi Wound Chart Details Patient Name: [name], [name]. Date of Service: 10/27/2018 9:15 AM Medical Record Number: [id number] Patient Account Number: [id number] Date of Birth/Sex: [birth year] (43 y.o. M) Treating RN: [name] Primary Care Keeva Reisen: Bluford [name], Tyrone Other Clinician: Referring [name]: Bluford [name], Tyrone Treating [name]/Extender: [name] Weeks in Treatment: 5 Vital Signs Height(in): 69 Pulse(bpm): 67 Weight(lbs): 230 Blood Pressure(mmHg): 161/90 Body Mass Index(BMI): 34 Temperature(F): 98.6 Respiratory Rate 18 (breaths/min): Photos: [N/[name]:N/[name]] Wound Location: Left Foot - Plantar N/[name] N/[name] Wounding Event: Blister N/[name] N/[name] Primary Etiology: Diabetic Wound/Ulcer of the N/[name] N/[name] Lower Extremity Comorbid History: Hypertension, Type II Diabetes N/[name] N/[name] Date  Acquired: 05/28/2018 N/[name] N/[name] Weeks of Treatment: 5 N/[name] N/[name] Wound Status: Open N/[name] N/[name] Measurements L x W x D 2.3x2x0.9 N/[name] N/[name] (cm) Area (cm) : 3.613 N/[name] N/[name] Volume (cm) : 3.252 N/[name] N/[name] % Reduction in Area: -6.50% N/[name] N/[name] % Reduction in Volume: 49.60% N/[name] N/[name] Classification: Grade 2 N/[name] N/[name] Exudate Amount: Medium N/[name] N/[name] Exudate Type: Serous N/[name] N/[name] Exudate Color: amber N/[name] N/[name] Wound Margin: Well defined, not attached N/[name] N/[name] Granulation Amount: Medium (34-66%) N/[name] N/[name] Granulation Quality: Red, Hyper-granulation N/[name] N/[name] Necrotic Amount: Small (1-33%) N/[name] N/[name] Necrotic Tissue: Eschar, Adherent Slough N/[name] N/[name] Exposed Structures: Fat Layer (Subcutaneous N/[name] N/[name] Tissue) Exposed: Yes Fascia: No Tendon: No Muscle:  No [name]. ([id number]) Joint: No Bone: No Epithelialization: None N/[name] N/[name] Treatment Notes Electronic Signature(s) Signed: 10/27/2018 4:33:47 PM By: [name] Entered By: [name] on 10/27/2018 09:36:49 [name]. ([id number]) -------------------------------------------------------------------------------- Multi-Disciplinary Care Plan Details Patient Name: [name], [name]. Date of Service: 10/27/2018 9:15 AM Medical Record Number: [id number] Patient Account Number: [id number] Date of Birth/Sex: February 15, 1976 (43 y.o. M) Treating RN: [name] Primary Care Trinidee Schrag: [name] Other Clinician: Referring [name]: [name] Treating Naser Schuld/Extender: Melburn Hake, [name] Weeks in Treatment: 5 Active Inactive Electronic Signature(s) Signed: 12/12/2018 4:25:00 PM By: Gretta Cool, BSN, RN, CWS, Kim RN, BSN Signed: 02/14/2019 4:49:13 PM By: [name] Previous Signature: 10/27/2018 4:33:47 PM Version By: [name] Entered By: Gretta Cool BSN, RN, CWS, Kim on 12/12/2018 16:24:59 Laury, Kasey AMarland Kitchen ([id number]) -------------------------------------------------------------------------------- Pain Assessment Details Patient Name:  [name], [name]. Date of Service: 10/27/2018 9:15 AM Medical Record Number: [id number] Patient Account Number: [id number] Date of Birth/Sex: [birth year] (43 y.o. M) Treating RN: Harold Barban Primary Care Onofre Gains: [name] Other Clinician: Referring Oniel Meleski: [name] Treating Cameryn Schum/Extender: Melburn Hake, [name] Weeks in Treatment: 5 Active Problems Location of Pain Severity and Description of Pain Patient Has Paino Yes Site Locations Pain Location: Pain in Ulcers Rate the pain. Current Pain Level: 10 Pain Management and Medication Current Pain Management: Electronic Signature(s) Signed: 10/27/2018 4:25:02 PM By: Harold Barban Entered By: Harold Barban on 10/27/2018 09:20:36 [name], Tyrone AMarland Kitchen ([id number]) -------------------------------------------------------------------------------- Patient/Caregiver Education Details Patient Name: Hyland, Regginald [name]. Date of Service: 10/27/2018 9:15 AM Medical Record Number: [id number] Patient Account Number: [id number] Date of Birth/Gender: 1975-06-24 (43 y.o. M) Treating RN: [name] Primary Care Physician: [name] Other Clinician: Referring Physician: Pernell [name] Treating Physician/Extender: Sharalyn Ink in Treatment: 5 Education Assessment Education Provided To: Patient Education Topics Provided Offloading: Handouts: Other: need for offloading Methods: Explain/Verbal Responses: State content correctly Electronic Signature(s) Signed: 10/27/2018 4:33:47 PM By: [name] Entered By: [name] on 10/27/2018 09:47:23 [name], [name]. ([id number]) -------------------------------------------------------------------------------- Wound Assessment Details Patient Name: [name], [name]. Date of Service: 10/27/2018 9:15 AM Medical Record Number: [id number] Patient Account Number: [id number] Date of Birth/Sex: 01/17/1976 (43 y.o. M) Treating RN: Harold Barban Primary Care  Celise Bazar: [name] Other Clinician: Referring Chonte Ricke: [name] Treating Levora Werden/Extender: Melburn Hake, [name] Weeks in Treatment: 5 Wound Status Wound Number: 1 Primary Etiology: Diabetic Wound/Ulcer of the Lower Extremity Wound Location: Left Foot - Plantar Wound Status: Open Wounding Event: Blister Comorbid Hypertension, Type II Diabetes Date Acquired: 05/28/2018 History: Weeks Of Treatment: 5 Clustered Wound: No Photos Wound Measurements Length: (cm) 2.3 Width: (cm) 2 Depth: (cm) 0.9 Area: (cm) 3.613 Volume: (cm) 3.252 % Reduction in Area: -6.5% % Reduction in Volume: 49.6% Epithelialization: None Tunneling: No Undermining: No Wound Description Classification: Grade 2 Foul Odor [name] Wound Margin: Well defined, not attached Slough/Fibr Exudate Amount: Medium Exudate Type: Serous Exudate Color: amber fter Cleansing: No ino Yes Wound Bed Granulation Amount: Medium (34-66%) Exposed Structure Granulation Quality: Red, Hyper-granulation Fascia Exposed: No Necrotic Amount: Small (1-33%) Fat Layer (Subcutaneous Tissue) Exposed: Yes Necrotic Quality: Eschar, Adherent Slough Tendon Exposed: No Muscle Exposed: No Joint Exposed: No Bone Exposed: No Electronic Signature(s) [name], [name]. ([id number]) Signed: 10/27/2018 4:25:02 PM By: Harold Barban Entered By: Harold Barban on 10/27/2018 09:26:37 [name], [name]. ([id number]) -------------------------------------------------------------------------------- Vitals Details Patient Name: Caffee, [name]. Date of Service: 10/27/2018 9:15 AM Medical Record Number: [id number] Patient Account Number: [id number] Date of Birth/Sex: 18-May-1975 (43 y.o. M) Treating RN: Harold Barban Primary  Care Johnisha Louks: Bluford [name], Tyrone Other Clinician: Referring Bryen Hinderman: Bluford [name], Tyrone Treating Gaylon Bentz/Extender: [name] Weeks in Treatment: 5 Vital Signs Time Taken: 09:20 Temperature (F):  98.6 Height (in): 69 Pulse (bpm): 67 Weight (lbs): 230 Respiratory Rate (breaths/min): 18 Body Mass Index (BMI): 34 Blood Pressure (mmHg): 161/90 Reference Range: 80 - 120 mg / dl Electronic Signature(s) Signed: 10/27/2018 4:25:02 PM By: [name] Entered By: [name] on 10/27/2018 09:21:34

## 2018-10-27 NOTE — Progress Notes (Addendum)
[name], [name] ([id number]) Visit Report for 10/27/2018 Chief Complaint Document Details Patient Name: [name], [name] Date of Service: 10/27/2018 9:15 AM Medical Record Number: [id number] Patient Account Number: [id number] Date of Birth/Sex: [birth year] (43 y.o. M) Treating RN: Curtis Sitesorthy, Joanna Primary Care Provider: Bluford Mainejan-Sie, Sheikh Other Clinician: Referring Provider: Bluford Mainejan-Sie, Sheikh Treating Provider/Extender: [name] Weeks in Treatment: 5 Information Obtained from: Patient Chief Complaint Left plantar foot ulcer Electronic Signature(s) Signed: 10/27/2018 9:22:44 AM By: Lenda KelpStone [name], Nelani Schmelzle PA-C Entered By: Lenda KelpStone [name], Keiandra Sullenger on 10/27/2018 09:22:43 [name], [name] ([id number]) -------------------------------------------------------------------------------- Debridement Details Patient Name: [name], [name] Date of Service: 10/27/2018 9:15 AM Medical Record Number: [id number] Patient Account Number: [id number] Date of Birth/Sex: [birth year] (43 y.o. M) Treating RN: Curtis Sitesorthy, Joanna Primary Care Provider: Bluford Mainejan-Sie, Sheikh Other Clinician: Referring Provider: Bluford Mainejan-Sie, Sheikh Treating Provider/Extender: Tyrone DibblesSTONE [name], Tyrone Hellmer Weeks in Treatment: 5 Debridement Performed for Wound #1 Left,Plantar Foot Assessment: Performed By: Physician [name], Markevious Ehmke E., PA-C Debridement Type: Debridement Severity of Tissue Pre Fat layer exposed Debridement: Level of Consciousness (Pre- Awake and Alert procedure): Pre-procedure Verification/Time Yes - 09:38 Out Taken: Start Time: 09:38 Pain Control: Lidocaine 4% Topical Solution Total Area Debrided (L x W): 2.3 (cm) x 2 (cm) = 4.6 (cm) Tissue and other material Viable, Non-Viable, Callus, Slough, Subcutaneous, Slough debrided: Level: Skin/Subcutaneous Tissue Debridement Description: Excisional Instrument: Curette Bleeding: Minimum Hemostasis Achieved: Pressure End Time: 09:43 Procedural Pain: 0 Post Procedural Pain:  0 Response to Treatment: Procedure was tolerated well Level of Consciousness Awake and Alert (Post-procedure): Post Debridement Measurements of Total Wound Length: (cm) 2.3 Width: (cm) 2 Depth: (cm) 1.1 Volume: (cm) 3.974 Character of Wound/Ulcer Post Debridement: Improved Severity of Tissue Post Debridement: Fat layer exposed Post Procedure Diagnosis Same as Pre-procedure Electronic Signature(s) Signed: 10/27/2018 4:33:47 PM By: Curtis Sitesorthy, Joanna Signed: 10/29/2018 6:04:32 PM By: Lenda KelpStone [name], Akesha Uresti PA-C Entered By: Curtis Sitesorthy, Joanna on 10/27/2018 09:45:49 [name], [name] ([id number]) -------------------------------------------------------------------------------- HPI Details Patient Name: [name], [name] Date of Service: 10/27/2018 9:15 AM Medical Record Number: [id number] Patient Account Number: [id number] Date of Birth/Sex: [birth year] (43 y.o. M) Treating RN: Curtis Sitesorthy, Joanna Primary Care Provider: Bluford Mainejan-Sie, Sheikh Other Clinician: Referring Provider: Bluford Mainejan-Sie, Sheikh Treating Provider/Extender: Tyrone DibblesSTONE [name], Boris Engelmann Weeks in Treatment: 5 History of Present Illness HPI Description: 09/22/18 on evaluation today patient presents for initial evaluation here in our clinic regarding issues that he has been having with his left plantar foot at the base of the second toe. He tells me that this has been present since January 2020. Subsequently she was seen by podiatry where he did have an x-ray of Aug 18, 2018. A culture was also obtained. The culture showed that he had group B strep noted but no other abnormal findings. He's been on doxycycline since that time. He is still doxycycline currently in fact is picking up another prescription today. Fortunately there's no signs of systemic infection. 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 Last  time you seen vascular as best I can tell was in 2017. This in fact that was on 05/27/15. At that point he had no evidence of hemodynamically significant inflow extraction. Overall things seem to be doing well which is excellent news. He hasn't however seen anyone since that time that has been about three years therefore I think it may be beneficial for him to go back to see them.  Especially in light of having the ulceration which again if he doesn't have sufficient flow he likely is not going to heal appropriately. His ABI today was 0.93 and seems to be okay hopefully this is an indication that things are doing well. Secondarily I do think is gonna require an MRI of the left foot I'm concerned about the fact that he has bone involvement right now and likely may have a bone infection. He is on the doxycycline which is good news but again the wound has not been improving according to what he tells me. 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 7/1; patient admitted to our clinic last week. We still have not managed to arrange an MRI as it is in the [facility] approval phase. Our staff called the Medicaid office to press for an urgent processing. He is using silver alginate. He has a Darco forefoot off loader 10/09/18-Patient returns at 2 weeks to clinic has been on doxycycline, insurance is denying MRI authorization, we are using silver cell dressing and Darco forefoot off loader not much change to the wound so far 10/20/18 on evaluation today patient is seen for reevaluation regarding his plantar foot wound that really does not seem to be significantly different from what it was last time I saw him. Has been several weeks back. With that being said he did see Dr. the very last time he was here he was placed on doxycycline at that point. Unfortunately there wasn't error with a prescription only recommending 10 pills when it should've been 20 and therefore it sounds like this never got feel the pharmacy did  not contact us and might've been some confusion with were Dr. Ericka Pontiff practices. Nonetheless the patient did not contact the office we have not been able to rectify this I will do so today. He did have a CT scan which revealed no obvious signs of osteomyelitis or septic arthritis nor abscess. There was however evidence of cellulitis noted on the scan. Other than this the patient states that he seems to be doing okay he still having a lot of drainage at this point. 10/27/2018 upon evaluation today patient actually appears to be doing decently well with regard to the left plantar foot ulcer. Again his CT scan did not show obvious signs of osteomyelitis although if anything continues to be a problem we can always perform an MRI nonetheless I am not seeing any evidence of severe infection at this time. I did recently change his antibiotic to clindamycin and have him discontinue the doxycycline that was just a couple of days ago secondary to the culture findings. Nonetheless I am hopeful that can help as well and already I feel like the wound is appearing to look much better. Nonetheless he does have foot drop he tells me that he is having difficulty walking with the offloading shoe at this time because is not able to wear his brace. Nonetheless it does seem to be helping his foot to some degree although he did have some callus as well. He sees vascular next week. Nonetheless I do believe that a total contact cast could be beneficial for him and may actually help him to walk better because it would again brace his foot similar to how his brace works for his foot drop. Nonetheless he tells me that he may be going out of town next Friday possibly to [place] I explained that I do not think that is a good thing for him to do with all  of the COVID-19 cases in [place] not even sure that he would not need to be on quarantine when he got back for 14 days just to ensure he did not develop any symptoms.  Nonetheless also think is health should take priority and really I believe he needs to see about TORBEN, SOLOWAY ([id number]) Electronic Signature(s) Signed: 10/27/2018 5:20:20 PM By: Lenda Kelp PA-C Entered By: Lenda Kelp on 10/27/2018 17:20:20 [name], [name] ([id number]) -------------------------------------------------------------------------------- Physical Exam Details Patient Name: Campo, Marquet A. Date of Service: 10/27/2018 9:15 AM Medical Record Number: [id number] Patient Account Number: [id number] Date of Birth/Sex: [birth year] (43 y.o. M) Treating RN: Curtis Sites Primary Care Provider: Bluford Main Other Clinician: Referring Provider: Bluford Main Treating Provider/Extender: Tyrone Dibbles, Marvelous Bouwens Weeks in Treatment: 5 Constitutional Well-nourished and well-hydrated in no acute distress. Respiratory normal breathing without difficulty. clear to auscultation bilaterally. Cardiovascular regular rate and rhythm with normal S1, S2. Psychiatric this patient is able to make decisions and demonstrates good insight into disease process. Alert and Oriented x 3. pleasant and cooperative. Notes Upon inspection today patient's wound bed did require some sharp debridement to clear away slough from the surface of the wound as well as some of the callus surrounding the edge of the wound down to good subcutaneous tissue. He tolerated this without complication post debridement the wound bed appears to be doing better. Electronic Signature(s) Signed: 10/27/2018 5:20:57 PM By: Lenda Kelp PA-C Entered By: Lenda Kelp on 10/27/2018 17:20:57 [name], Tyrone AMarland Kitchen ([id number]) -------------------------------------------------------------------------------- Physician Orders Details Patient Name: Tindel, Slayde A. Date of Service: 10/27/2018 9:15 AM Medical Record Number: [id number] Patient Account Number: [id number] Date of Birth/Sex: 1975-07-23 (43 y.o.  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 Primary Wound Dressing Wound #1 Left,Plantar Foot o Silver Alginate Secondary Dressing Wound #1 Left,Plantar Foot o Dry Gauze o Conform/Kerlix o Coban Dressing Change Frequency Wound #1 Left,Plantar Foot o Change dressing every other day. Follow-up Appointments Wound #1 Left,Plantar Foot o Return Appointment in 1 week. Off-Loading Wound #1 Left,Plantar Foot o Open toe surgical shoe to: - left foot Electronic Signature(s) Stockard, Bradshaw A. ([id number]) Signed: 10/27/2018 4:33:47 PM By: Curtis Sites Signed: 10/29/2018 6:04:32 PM By: Lenda Kelp PA-C Entered By: Curtis Sites on 10/27/2018 09:46:47 Philyaw, Katie A. ([id number]) -------------------------------------------------------------------------------- Problem List Details Patient Name: Gaster, [name] Date of Service: 10/27/2018 9:15 AM Medical Record Number: [id number] Patient Account Number: [id number] Date of Birth/Sex: 04/16/1975 (43 y.o. M) Treating RN: Curtis Sites Primary Care Provider: Bluford Main Other Clinician: Referring  Provider: Bluford Main Treating Provider/Extender: Tyrone Dibbles, Elzada Pytel Weeks in Treatment: 5 Active Problems ICD-10 Evaluated Encounter Code Description Active Date Today Diagnosis E11.621 Type 2 diabetes mellitus with foot ulcer 09/22/2018 No Yes L97.522 Non-pressure chronic ulcer of other part of left foot with fat  09/22/2018 No Yes layer exposed I70.312 Atherosclerosis of unspecified type of bypass graft(s) of the 09/22/2018 No Yes extremities with intermittent claudication, left leg E11.40 Type 2 diabetes mellitus with diabetic neuropathy, 09/22/2018 No Yes unspecified Inactive Problems Resolved Problems Electronic Signature(s) Signed: 10/27/2018 9:22:36 AM By: Worthy Keeler PA-C Entered By: Worthy Keeler on 10/27/2018 09:22:35 Binette, Swade A. ([id number]) -------------------------------------------------------------------------------- Progress Note Details Patient Name: [name], [name] Date of Service: 10/27/2018 9:15 AM Medical Record Number: [id number] Patient Account Number: [id number] Date of Birth/Sex: December 07, 1975 (43 y.o. M) Treating RN: Montey Hora Primary Care Provider: Pernell Dupre Other Clinician: Referring Provider: Pernell Dupre Treating Provider/Extender: Melburn Hake, Eleyna Brugh Weeks in Treatment: 5 Subjective Chief Complaint Information obtained from Patient Left plantar foot ulcer History of Present Illness (HPI) 09/22/18 on evaluation today patient presents for initial evaluation here in our clinic regarding issues that he has been having with his left plantar foot at the base of the second toe. He tells me that this has been present since January 2020. Subsequently she was seen by podiatry where he did have an x-ray of Aug 18, 2018. A culture was also obtained. The culture showed that he had group B strep noted but no other abnormal findings. He's been on doxycycline since that time. He is still doxycycline currently in fact is picking up another  prescription today. Fortunately there's no signs of systemic infection. 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 Last time you seen vascular as best I can tell was in 2017. This in fact that was on 05/27/15. At that point he had no evidence of hemodynamically significant inflow extraction. Overall things seem to be doing well which is excellent news. He hasn't however seen anyone since that time that has been about three years therefore I think it may be beneficial for him to go back to see them. Especially in light of having the ulceration which again if he doesn't have sufficient flow he likely is not going to heal appropriately. His ABI today was 0.93 and seems to be okay hopefully this is an indication that things are doing well. Secondarily I do think is gonna require an MRI of the left foot I'm concerned about the fact that he has bone involvement right now and likely may have a bone infection. He is on the doxycycline which is good news but again the wound has not been improving according to what he tells me. 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 7/1; patient admitted to our clinic last week. We still have not managed to arrange an MRI as it is in the [facility] approval phase. Our staff called the Medicaid office to press for an urgent processing. He is using silver alginate. He has a Darco forefoot off loader 10/09/18-Patient returns at 2 weeks to clinic has been on doxycycline, insurance is denying MRI authorization, we are using silver cell dressing and Darco forefoot off loader not much change to the wound so far 10/20/18 on evaluation today patient is seen for reevaluation regarding his plantar foot wound that really does not seem to be significantly different from what it was last time I saw him. Has been several weeks back. With that  being said he did see Dr. the very last time he was here he was placed on doxycycline at that point. Unfortunately there wasn't error  with a prescription only recommending 10 pills when it should've been 20 and therefore it sounds like this never got feel the pharmacy did not contact us and might've been some confusion with were Dr. Ericka Pontiff practices. Nonetheless the patient did not contact the office we have not been able to rectify this I will do so today. He did have a CT scan which revealed no obvious signs of osteomyelitis or septic arthritis nor abscess. There was however evidence of cellulitis noted on the scan. Other than this the patient states that he seems to be doing okay he still having a lot of drainage at this point. 10/27/2018 upon evaluation today patient actually appears to be doing decently well with regard to the left plantar foot ulcer. Again his CT scan did not show obvious signs of osteomyelitis although if anything continues to be a problem we can always perform an MRI nonetheless I am not seeing any evidence of severe infection at this time. I did recently change his antibiotic to clindamycin and have him discontinue the doxycycline that was just a couple of days ago secondary to the culture findings. Nonetheless I am hopeful that can help as well and already I feel like the wound is appearing to look much better. Nonetheless he does have foot drop he tells me that he is having difficulty walking with the offloading shoe at this time because is not able to wear his brace. Nonetheless it does seem to be helping his foot to some degree although he did have some callus as well. He sees vascular next week. Nonetheless I do believe that a total contact cast could be beneficial for him and may actually help him to walk better because it would again brace his foot similar to how his brace works for his foot drop. [name], [name] ([id number]) Nonetheless he tells me that he may  be going out of town next Friday possibly to [place] I explained that I do not think that is a good thing for him to do with all of the COVID-19 cases in [place] not even sure that he would not need to be on quarantine when he got back for 14 days just to ensure he did not develop any symptoms. Nonetheless also think is health should take priority and really I believe he needs to see about Patient History Information obtained from Patient. Family History Cancer - Father, Diabetes - Father,Mother, Hypertension - Father, Lung Disease - Father, Stroke - Father, Thyroid Problems - Mother, No family history of Heart Disease, Hereditary Spherocytosis, Kidney Disease, Seizures, Tuberculosis. Social History Current every day smoker - 1/2 pack day, Marital Status - Single, Alcohol Use - Rarely, Drug Use - No History, Caffeine Use - Daily - soda. Medical History Eyes Denies history of Cataracts, Glaucoma, Optic Neuritis Ear/Nose/Mouth/Throat Denies history of Chronic sinus problems/congestion, Middle ear problems Hematologic/Lymphatic Denies history of Anemia, Hemophilia, Human Immunodeficiency Virus, Lymphedema, Sickle Cell Disease Respiratory Denies history of Aspiration, Asthma, Chronic Obstructive Pulmonary Disease (COPD), Pneumothorax, Sleep Apnea, Tuberculosis Cardiovascular Patient has history of Hypertension Denies history of Angina, Arrhythmia, Congestive Heart Failure, Coronary Artery Disease, Deep Vein Thrombosis, Hypotension, Myocardial Infarction, Peripheral Arterial Disease, Peripheral Venous Disease, Phlebitis, Vasculitis Gastrointestinal Denies history of Cirrhosis , Colitis, Crohn s, Hepatitis A, Hepatitis B, Hepatitis C Endocrine Patient has history of Type II Diabetes Genitourinary Denies history of End Stage Renal Disease Immunological Denies history of Lupus Erythematosus, Raynaud s, Scleroderma Integumentary (Skin) Denies history of History of Burn, History of pressure  wounds Musculoskeletal Denies history of Gout, Rheumatoid Arthritis, Osteoarthritis, Osteomyelitis Neurologic Denies history of Dementia, Neuropathy, Quadriplegia, Paraplegia, Seizure Disorder Oncologic Denies history of Received Chemotherapy, Received Radiation Psychiatric Denies history of Anorexia/bulimia, Confinement Anxiety Review of Systems (ROS) Constitutional Symptoms (General Health) Denies complaints or symptoms of Fatigue, Fever, Chills, Marked Weight Change. Respiratory Denies complaints or symptoms of Chronic or frequent coughs, Shortness of Breath. Cardiovascular Denies complaints or symptoms of Chest pain, LE edema. Psychiatric Denies complaints or symptoms of Anxiety, Claustrophobia. [name], [name] ([id number]) Objective Constitutional Well-nourished and well-hydrated in no acute distress. Vitals Time Taken: 9:20 AM, Height: 69 in, Weight: 230 lbs, BMI: 34, Temperature: 98.6 F, Pulse: 67 bpm, Respiratory Rate: 18 breaths/min, Blood Pressure: 161/90 mmHg. Respiratory normal breathing without difficulty. clear to auscultation bilaterally. Cardiovascular regular rate and rhythm with normal S1, S2. Psychiatric this patient is able to make decisions and demonstrates good insight into disease process. Alert and Oriented x 3. pleasant and cooperative. General Notes: Upon inspection today patient's wound bed did require some sharp debridement to clear away slough from the surface of the wound as well as some of the callus surrounding the edge of the wound down to good subcutaneous tissue. He tolerated this without complication post debridement the wound bed appears to be doing better. Integumentary (Hair, Skin) Wound #1 status is Open. Original cause of wound was Blister. The wound is located on the Left,Plantar Foot. The wound measures 2.3cm length x 2cm width x 0.9cm depth; 3.613cm^2 area and 3.252cm^3 volume. There is Fat Layer (Subcutaneous Tissue) Exposed  exposed. There is no tunneling or undermining noted. There is a medium amount of serous drainage noted. The wound margin is well defined and not attached to the wound base. There is medium (34-66%) red, hyper - granulation within the wound bed. There is a small (1-33%) amount of necrotic tissue within the wound bed including Eschar and Adherent Slough. 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 ([id number]) Procedures Wound #1 Pre-procedure diagnosis of Wound #1 is a Diabetic Wound/Ulcer of the Lower Extremity located on the Left,Plantar Foot .Severity of Tissue Pre Debridement is: Fat layer exposed. There was a Excisional Skin/Subcutaneous Tissue Debridement with a total area of 4.6 sq cm performed by [name], Kemiah Booz E., PA-C. With the following instrument(s): Curette to remove Viable and Non-Viable tissue/material. Material removed includes Callus, Subcutaneous Tissue, and Slough after achieving pain control using Lidocaine 4% Topical Solution. No specimens were taken. A time out was conducted at 09:38, prior to the start of the procedure. A Minimum amount of bleeding was controlled with Pressure. The procedure was tolerated well with a pain level of 0 throughout and a pain level of 0 following the procedure. Post Debridement Measurements: 2.3cm length x 2cm width x 1.1cm depth; 3.974cm^3 volume. Character of Wound/Ulcer Post Debridement is improved. Severity of Tissue Post Debridement is: Fat layer exposed. Post procedure Diagnosis Wound #1: Same as Pre-Procedure Plan Wound Cleansing: Wound #1 Left,Plantar Foot: Cleanse wound with mild soap and water - Dial Anti bacterial  soap Anesthetic (add to Medication List): Wound #1 Left,Plantar Foot: Topical Lidocaine 4% cream applied to wound bed prior to debridement (In Clinic Only). Primary Wound Dressing: Wound #1 Left,Plantar Foot: Silver Alginate Secondary Dressing: Wound #1  Left,Plantar Foot: Dry Gauze Conform/Kerlix Coban Dressing Change Frequency: Wound #1 Left,Plantar Foot: Change dressing every other day. Follow-up Appointments: Wound #1 Left,Plantar Foot: Return Appointment in 1 week. Off-Loading: Wound #1 Left,Plantar Foot: Open toe surgical shoe to: - left foot 1. I am get a recommend currently that we have the patient continue with the antibiotics over the next 14 days he is already taking 2 of those 14 days as of today. I think this will definitely keep things under control from the standpoint of infection. 2. I am also going to suggest that we continue with a silver alginate dressing since this seems to be beneficial for him I am hopeful he will continue to show signs of improvement. 3. With regard to the offloading I do believe that I would recommend he continue with the offloading PEG assist shoe for the time being. With that being said I really think a total contact cast will be best for him. Nonetheless we will have to wait and see whether or not he goes out of town and what we are able to do at that point. I again advised him not to go to [place] I think that he should stay and maintain his care here in the clinic I think that could be more detrimental for him. He seems to be taken into consideration although I am not sure what is going to do. 4. I will plan to see him in 1 week. Obviously if he is out of town then we will not be able to do so we will then come up with a Lykins, Samanyu A. ([id number]) plan as to the next best steps at that point. Otherwise if he is here we will discuss plans for initiating the total contact cast at that point. We will see patient back for reevaluation in  1 week here in the clinic. If anything worsens or changes patient will contact our office for additional recommendations. Electronic Signature(s) Signed: 10/27/2018 5:22:38 PM By: Lenda Kelp PA-C Entered By: Lenda Kelp on 10/27/2018 17:22:38 [name], Tyrone AMarland Kitchen ([id number]) -------------------------------------------------------------------------------- ROS/PFSH Details Patient Name: Tyrone Mango A. Date of Service: 10/27/2018 9:15 AM Medical Record Number: [id number] Patient Account Number: [id number] Date of Birth/Sex: 1975-06-06 (43 y.o. M) Treating RN: Curtis Sites Primary Care Provider: Bluford Main Other Clinician: Referring Provider: Bluford Main Treating Provider/Extender: Tyrone Dibbles, Barbra Miner Weeks in Treatment: 5 Information Obtained From Patient Constitutional Symptoms (General Health) Complaints and Symptoms: Negative for: Fatigue; Fever; Chills; Marked Weight Change Respiratory Complaints and Symptoms: Negative for: Chronic or frequent coughs; Shortness of Breath Medical History: Negative for: Aspiration; Asthma; Chronic Obstructive Pulmonary Disease (COPD); Pneumothorax; Sleep Apnea; Tuberculosis Cardiovascular Complaints and Symptoms: Negative for: Chest pain; LE edema Medical History: Positive for: Hypertension Negative for: Angina; Arrhythmia; Congestive Heart Failure; Coronary Artery Disease; Deep Vein Thrombosis; Hypotension; Myocardial Infarction; Peripheral Arterial Disease; Peripheral Venous Disease; Phlebitis; Vasculitis Psychiatric Complaints and Symptoms: Negative for: Anxiety; Claustrophobia Medical History: Negative for: Anorexia/bulimia; Confinement Anxiety Eyes Medical History: Negative for: Cataracts; Glaucoma; Optic Neuritis Ear/Nose/Mouth/Throat Medical History: Negative for: Chronic sinus problems/congestion; Middle ear problems Hematologic/Lymphatic Medical History: Negative for: Anemia; Hemophilia; Human  Immunodeficiency Virus; Lymphedema; Sickle Cell Disease Mis, Yohann A. ([id number]) Gastrointestinal Medical History: Negative for: Cirrhosis ; Colitis; Crohnos; Hepatitis A; Hepatitis B; Hepatitis C Endocrine Medical History: Positive for: Type II Diabetes Time with diabetes: 2 years Treated with: Insulin, Oral agents Genitourinary Medical History: Negative for: End Stage Renal Disease Immunological Medical History: Negative for: Lupus Erythematosus; Raynaudos; Scleroderma Integumentary (  Skin) Medical History: Negative for: History of Burn; History of pressure wounds Musculoskeletal Medical History: Negative for: Gout; Rheumatoid Arthritis; Osteoarthritis; Osteomyelitis Neurologic Medical History: Negative for: Dementia; Neuropathy; Quadriplegia; Paraplegia; Seizure Disorder Oncologic Medical History: Negative for: Received Chemotherapy; Received Radiation Immunizations Pneumococcal Vaccine: Received Pneumococcal Vaccination: No Implantable Devices None Family and Social History Cancer: Yes - Father; Diabetes: Yes - Father,Mother; Heart Disease: No; Hereditary Spherocytosis: No; Hypertension: Yes - Father; Kidney Disease: No; Lung Disease: Yes - Father; Seizures: No; Stroke: Yes - Father; Thyroid Problems: Yes - Mother; Tuberculosis: No; Current every day smoker - 1/2 pack day; Marital Status - Single; Alcohol Use: Rarely; Drug Use: No History; Caffeine Use: Daily - soda; Financial Concerns: No; Food, Clothing or Shelter Needs: No; Support System Lacking: No; Transportation Concerns: No Physician Affirmation I have reviewed and agree with the above information. [name], Lynwood A. ([id number]) Electronic Signature(s) Signed: 10/29/2018 6:04:32 PM By: Lenda KelpStone [name], Herlinda Heady PA-C Signed: 10/30/2018 3:29:41 PM By: Curtis Sitesorthy, Joanna Entered By: Lenda KelpStone [name], Michaele Amundson on 10/27/2018 17:20:43 Muellner, Franko A.  ([id number]) -------------------------------------------------------------------------------- SuperBill Details Patient Name: Pusch, Bienvenido A. Date of Service: 10/27/2018 Medical Record Number: [id number] Patient Account Number: [id number] Date of Birth/Sex: 07/03/1975 (43 y.o. 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 leg E11.40 Type 2 diabetes mellitus with diabetic neuropathy, unspecified Facility Procedures CPT4 Code: [id number] Description: 11042 - DEB SUBQ TISSUE 20 SQ CM/< ICD-10 Diagnosis Description L97.522 Non-pressure chronic ulcer of other part of left foot with fat l Modifier: ayer exposed Quantity: 1 Physician Procedures CPT4: Description Modifier Quantity Code [id number] 99214 - WC PHYS LEVEL 4 - EST PT 25 1 ICD-10 Diagnosis Description E11.621 Type 2 diabetes mellitus with foot ulcer L97.522 Non-pressure chronic ulcer of other part of left foot with fat layer exposed  I70.312 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

## 2018-11-03 ENCOUNTER — Ambulatory Visit: Payer: PRIVATE HEALTH INSURANCE | Admitting: Physician Assistant

## 2018-11-10 ENCOUNTER — Ambulatory Visit: Payer: PRIVATE HEALTH INSURANCE | Admitting: Physician Assistant

## 2019-01-25 ENCOUNTER — Emergency Department (HOSPITAL_COMMUNITY)
Admission: EM | Admit: 2019-01-25 | Discharge: 2019-01-25 | Disposition: A | Discharge status: Home / Self Care | Payer: Medicaid Other | Attending: Emergency Medicine | Admitting: Emergency Medicine

## 2019-01-25 ENCOUNTER — Encounter (HOSPITAL_COMMUNITY): Payer: Self-pay | Admitting: Emergency Medicine

## 2019-01-25 DIAGNOSIS — F17200 Nicotine dependence, unspecified, uncomplicated: Secondary | ICD-10-CM | POA: Diagnosis not present

## 2019-01-25 DIAGNOSIS — Z7982 Long term (current) use of aspirin: Secondary | ICD-10-CM | POA: Insufficient documentation

## 2019-01-25 DIAGNOSIS — R1010 Upper abdominal pain, unspecified: Secondary | ICD-10-CM | POA: Diagnosis not present

## 2019-01-25 DIAGNOSIS — Z79899 Other long term (current) drug therapy: Secondary | ICD-10-CM | POA: Diagnosis not present

## 2019-01-25 DIAGNOSIS — M5489 Other dorsalgia: Secondary | ICD-10-CM | POA: Diagnosis present

## 2019-01-25 LAB — CBC
HCT: 45.6 % (ref 39.0–52.0)
Hemoglobin: 15.8 g/dL (ref 13.0–17.0)
MCH: 32.1 pg (ref 26.0–34.0)
MCHC: 34.6 g/dL (ref 30.0–36.0)
MCV: 92.7 fL (ref 80.0–100.0)
Platelets: 233 10*3/uL (ref 150–400)
RBC: 4.92 MIL/uL (ref 4.22–5.81)
RDW: 14.1 % (ref 11.5–15.5)
WBC: 7 10*3/uL (ref 4.0–10.5)
nRBC: 0 % (ref 0.0–0.2)

## 2019-01-25 LAB — COMPREHENSIVE METABOLIC PANEL
ALT: 34 U/L (ref 0–44)
AST: 18 U/L (ref 15–41)
Albumin: 4.1 g/dL (ref 3.5–5.0)
Alkaline Phosphatase: 108 U/L (ref 38–126)
Anion gap: 11 (ref 5–15)
BUN: 15 mg/dL (ref 6–20)
CO2: 22 mmol/L (ref 22–32)
Calcium: 10.1 mg/dL (ref 8.9–10.3)
Chloride: 107 mmol/L (ref 98–111)
Creatinine, Ser: 1.09 mg/dL (ref 0.61–1.24)
GFR calc Af Amer: >60 mL/min (ref >60)
GFR calc non Af Amer: >60 mL/min (ref >60)
Glucose, Bld: 129 mg/dL — ABNORMAL HIGH (ref 70–99)
Potassium: 3.8 mmol/L (ref 3.5–5.1)
Sodium: 140 mmol/L (ref 135–145)
Total Bilirubin: 0.8 mg/dL (ref 0.3–1.2)
Total Protein: 7.1 g/dL (ref 6.5–8.1)

## 2019-01-25 LAB — URINALYSIS, ROUTINE W REFLEX MICROSCOPIC
Bilirubin Urine: NEGATIVE
Glucose, UA: NEGATIVE mg/dL
Hgb urine dipstick: NEGATIVE
Ketones, ur: NEGATIVE mg/dL
Leukocytes,Ua: NEGATIVE
Nitrite: NEGATIVE
Protein, ur: NEGATIVE mg/dL
Specific Gravity, Urine: 1.028 (ref 1.005–1.030)
pH: 5 (ref 5.0–8.0)

## 2019-01-25 LAB — LIPASE, BLOOD: Lipase: 22 U/L (ref 11–51)

## 2019-01-25 MED ORDER — ACETAMINOPHEN 325 MG PO TABS: 650.0000 mg | ORAL_TABLET | Freq: Four times a day (QID) | ORAL | 0 refills | Status: DC | PRN

## 2019-01-25 NOTE — Discharge Instructions (Signed)
We saw you in the ER for the abdominal pain. All the results in the ER are normal.  Kidney enzyme, liver enzyme, labs looking for infection, labs looking for bladder infection are all reassuring. We are not sure what is causing your symptoms. The workup in the ER is not complete, and is limited to screening for life threatening and emergent conditions only, so please see a primary care doctor for further evaluation. We recommend that you take Tylenol 650 mg every 6 hours for now along with clear liquid diet.  Please return to the ER if your symptoms worsen; you have increased pain, fevers, chills, inability to keep any medications down, confusion. Otherwise see the outpatient doctor as requested.

## 2019-01-25 NOTE — ED Provider Notes (Signed)
[facility] Provider Note   CSN: [id number] Arrival date & time: 01/25/19  1313     History   Chief Complaint Chief Complaint  Patient presents with  . Back Pain  . Abdominal Pain    HPI [name] is a 43 y.o. male.     HPI 43 year old with history of diabetes no abdominal surgical history comes in with chief complaint of back pain and abdominal pain.  He also has history of chronic wound and neuropathy.  Patient reports that for the past 3 weeks he has been having off-and-on abdominal pain.  The pain is located in the upper quadrants.  He describes the pain is sharp and cramping.  There is no specific evoking, aggravating or relieving factors.  He is unsure if the pain is related to p.o. intake.  There is no history of similar pain and this pain is not resolving.  He has taken antacids with no significant relief.  He denies any UTI-like symptoms.  He has nausea but no vomiting.  He is also having pain in the flank region.  There is no history of kidney stones.  History reviewed. No pertinent past medical history.  Patient Active Problem List   Diagnosis Date Noted  . Left knee dislocation 03/03/2014  . Acute blood loss anemia 03/03/2014  . MRSA infection 03/03/2014  . Hereditary and idiopathic peripheral neuropathy 03/03/2014    History reviewed. No pertinent surgical history.      Home Medications    Prior to Admission medications   Medication Sig Start Date End Date Taking? Authorizing Provider  acetaminophen (TYLENOL) 325 MG tablet Take 2 tablets (650 mg total) by mouth every 6 (six) hours as needed. 01/25/19   Derwood KaplanNanavati, Minah Axelrod, MD  aspirin 81 MG tablet Take 81 mg by mouth daily.    [provider]  atorvastatin (LIPITOR) 40 MG tablet TAKE 1 TABLET BY MOUTH EVERYDAY AT BEDTIME 04/01/18   [provider]  celecoxib (CELEBREX) 200 MG capsule Take 200 mg by mouth 2 (two) times daily.    [provider]  chlorthalidone (HYGROTON) 25 MG tablet TAKE 1 TABLET BY MOUTH EVERY DAY IN THE MORNING 05/05/18   [provider]  cyanocobalamin 500 MCG tablet Take 500 mcg by mouth daily.    [provider]  doxycycline (VIBRA-TABS) 100 MG tablet Take 1 tablet (100 mg total) by mouth 2 (two) times daily. 09/15/18   Felecia ShellingEvans, Brent M, DPM  DULoxetine (CYMBALTA) 60 MG capsule Take 60 mg by mouth daily.    [provider]  ferrous sulfate 325 (65 FE) MG EC tablet Take 325 mg by mouth daily with breakfast.    [provider]  gabapentin (NEURONTIN) 600 MG tablet Take 600 mg by mouth 3 (three) times daily.    [provider]  Insulin Glargine (LANTUS SOLOSTAR) 100 UNIT/ML Solostar Pen Inject into the skin. 03/15/16   [provider]  lisinopril (PRINIVIL,ZESTRIL) 40 MG tablet TAKE 1 TABLET BY MOUTH EVERY DAY IN THE MORNING 05/05/18   [provider]  metFORMIN (GLUCOPHAGE) 500 MG tablet Take by mouth. 03/15/16   [provider]  naproxen (NAPROSYN) 500 MG tablet TAKE ONE (1) TABLET BY MOUTH TWO (2) TIMES DAILY WITH FOOD 03/23/16   [provider]  omeprazole (PRILOSEC) 20 MG capsule Take 20 mg by mouth daily.    [provider]  oxycodone (OXY-IR) 5 MG capsule Take 10 mg by mouth every 3 (three)  hours as needed.    [provider]  Oxycodone HCl 10 MG TABS Take one tablet by mouth every 4 hours as needed for pain 04/05/14   Reed, Tiffany L, DO  OxyCODONE HCl ER (OXYCONTIN) 30 MG T12A Take one tablet by mouth every 12 hours for pain. Do not crush 04/05/14   Reed, Tiffany L, DO  pregabalin (LYRICA) 150 MG capsule Take by mouth. 02/23/17   [provider]  sertraline (ZOLOFT) 50 MG tablet TAKE 1 TABLET BY MOUTH EVERY DAY IN THE MORNING 02/10/18   [provider]  simvastatin (ZOCOR) 40 MG tablet Take by mouth. 03/15/16   [provider]  traMADol (ULTRAM) 50 MG tablet Take 50 mg by mouth 3 (three) times  daily as needed. 07/09/18   [provider]    Family History History reviewed. No pertinent family history.  Social History Social History   Tobacco Use  . Smoking status: Current Every Day Smoker  . Smokeless tobacco: Never Used  Substance Use Topics  . Alcohol use: Yes    Alcohol/week: 0.0 standard drinks  . Drug use: No     Allergies   Patient has no known allergies.   Review of Systems Review of Systems  Constitutional: Positive for activity change. Negative for chills and fever.  Gastrointestinal: Positive for abdominal pain.  Genitourinary: Positive for flank pain. Negative for dysuria and frequency.  Musculoskeletal: Positive for back pain.  Allergic/Immunologic: Negative for immunocompromised state.  Hematological: Does not bruise/bleed easily.  All other systems reviewed and are negative.    Physical Exam Updated Vital Signs BP (!) 144/101 (BP Location: Left Arm)   Pulse 85   Temp 98.6 F (37 C) (Oral)   Resp 18   SpO2 100%   Physical Exam Vitals signs and nursing note reviewed.  Constitutional:      Appearance: He is well-developed.  HENT:     Head: Atraumatic.  Neck:     Musculoskeletal: Neck supple.  Cardiovascular:     Rate and Rhythm: Normal rate.  Pulmonary:     Effort: Pulmonary effort is normal.  Abdominal:     Tenderness: There is no abdominal tenderness.  Skin:    General: Skin is warm.  Neurological:     Mental Status: He is alert and oriented to person, place, and time.      ED Treatments / Results  Labs (all labs ordered are listed, but only abnormal results are displayed) Labs Reviewed  COMPREHENSIVE METABOLIC PANEL - Abnormal; Notable for the following components:      Result Value   Glucose, Bld 129 (*)    All other components within normal limits  URINALYSIS, ROUTINE W REFLEX MICROSCOPIC - Abnormal; Notable for the following components:   APPearance HAZY (*)    All other components within normal limits   LIPASE, BLOOD  CBC    EKG None  Radiology No results found.  Procedures Procedures (including critical care time)  Medications Ordered in ED Medications - No data to display   Initial Impression / Assessment and Plan / ED Course  I have reviewed the triage vital signs and the nursing notes.  Pertinent labs & imaging results that were available during my care of the patient were reviewed by me and considered in my medical decision making (see chart for details).        43 year old comes in a chief complaint of abdominal pain.  DDx includes: Pancreatitis Hepatobiliary pathology including cholecystitis Gastritis/PUD SBO Gastroparesis  Patient has no abdominal tenderness right now.  He has some flank discomfort, however the pain does not seem consistent with kidney stone.  Also his urine is completely clean and we do not think he has pyelonephritis.  He has a follow-up appoint with PCP tomorrow and we have advised him to continue his diagnostic work-up with them as chances of emergent etiology for this discomfort that has been going on intermittently for 3 weeks is highly unlikely given the normal-appearing labs and reassuring exam.  Strict ER return precautions have been discussed, and patient is agreeing with the plan and is comfortable with the workup done and the recommendations from the ER.    Final Clinical Impressions(s) / ED Diagnoses   Final diagnoses:  Pain of upper abdomen    ED Discharge Orders         Ordered    acetaminophen (TYLENOL) 325 MG tablet  Every 6 hours PRN     01/25/19 1849           Varney Biles, MD 01/25/19 1856

## 2019-01-25 NOTE — ED Triage Notes (Signed)
Pt to ER for 3 week history of lower abdominal pain and back pain. Denies nausea, vomiting, and diarrhea. Denies difficulty using the bathroom .

## 2019-07-03 ENCOUNTER — Other Ambulatory Visit (HOSPITAL_COMMUNITY)
Admit: 2019-07-03 | Discharge: 2019-07-03 | Disposition: A | Discharge status: Home / Self Care | Payer: Medicaid Other | Source: Ambulatory Visit | Attending: Orthopedic Surgery | Admitting: Orthopedic Surgery

## 2019-07-03 DIAGNOSIS — L02416 Cutaneous abscess of left lower limb: Secondary | ICD-10-CM | POA: Diagnosis not present

## 2019-07-03 LAB — CBC WITH DIFFERENTIAL/PLATELET
Abs Immature Granulocytes: 0.04 10*3/uL (ref 0.00–0.07)
Basophils Absolute: 0.1 10*3/uL (ref 0.0–0.1)
Basophils Relative: 1 %
Eosinophils Absolute: 0.5 10*3/uL (ref 0.0–0.5)
Eosinophils Relative: 5 %
HCT: 34 % — ABNORMAL LOW (ref 39.0–52.0)
Hemoglobin: 10.8 g/dL — ABNORMAL LOW (ref 13.0–17.0)
Immature Granulocytes: 0 %
Lymphocytes Relative: 24 %
Lymphs Abs: 2.4 10*3/uL (ref 0.7–4.0)
MCH: 30.9 pg (ref 26.0–34.0)
MCHC: 31.8 g/dL (ref 30.0–36.0)
MCV: 97.4 fL (ref 80.0–100.0)
Monocytes Absolute: 0.7 10*3/uL (ref 0.1–1.0)
Monocytes Relative: 7 %
Neutro Abs: 6.3 10*3/uL (ref 1.7–7.7)
Neutrophils Relative %: 63 %
Platelets: 382 10*3/uL (ref 150–400)
RBC: 3.49 MIL/uL — ABNORMAL LOW (ref 4.22–5.81)
RDW: 15.4 % (ref 11.5–15.5)
WBC: 10 10*3/uL (ref 4.0–10.5)
nRBC: 0 % (ref 0.0–0.2)

## 2019-07-03 LAB — BASIC METABOLIC PANEL
Anion gap: 11 (ref 5–15)
BUN: 7 mg/dL (ref 6–20)
CO2: 26 mmol/L (ref 22–32)
Calcium: 9 mg/dL (ref 8.9–10.3)
Chloride: 110 mmol/L (ref 98–111)
Creatinine, Ser: 0.81 mg/dL (ref 0.61–1.24)
GFR calc Af Amer: >60 mL/min (ref >60)
GFR calc non Af Amer: >60 mL/min (ref >60)
Glucose, Bld: 121 mg/dL — ABNORMAL HIGH (ref 70–99)
Potassium: 3.6 mmol/L (ref 3.5–5.1)
Sodium: 147 mmol/L — ABNORMAL HIGH (ref 135–145)

## 2019-07-03 LAB — VANCOMYCIN, TROUGH: Vancomycin Tr: 19 ug/mL (ref 15–20)

## 2019-07-05 ENCOUNTER — Other Ambulatory Visit (HOSPITAL_COMMUNITY)
Admit: 2019-07-05 | Discharge: 2019-07-05 | Disposition: A | Discharge status: Home / Self Care | Payer: Medicaid Other | Source: Ambulatory Visit | Attending: Orthopedic Surgery | Admitting: Orthopedic Surgery

## 2019-07-05 DIAGNOSIS — L02416 Cutaneous abscess of left lower limb: Secondary | ICD-10-CM | POA: Diagnosis not present

## 2019-07-05 LAB — CBC WITH DIFFERENTIAL/PLATELET
Abs Immature Granulocytes: 0.04 10*3/uL (ref 0.00–0.07)
Basophils Absolute: 0.1 10*3/uL (ref 0.0–0.1)
Basophils Relative: 1 %
Eosinophils Absolute: 0.4 10*3/uL (ref 0.0–0.5)
Eosinophils Relative: 5 %
HCT: 35.4 % — ABNORMAL LOW (ref 39.0–52.0)
Hemoglobin: 11.2 g/dL — ABNORMAL LOW (ref 13.0–17.0)
Immature Granulocytes: 1 %
Lymphocytes Relative: 24 %
Lymphs Abs: 2 10*3/uL (ref 0.7–4.0)
MCH: 31.3 pg (ref 26.0–34.0)
MCHC: 31.6 g/dL (ref 30.0–36.0)
MCV: 98.9 fL (ref 80.0–100.0)
Monocytes Absolute: 0.6 10*3/uL (ref 0.1–1.0)
Monocytes Relative: 7 %
Neutro Abs: 5.4 10*3/uL (ref 1.7–7.7)
Neutrophils Relative %: 62 %
Platelets: 416 10*3/uL — ABNORMAL HIGH (ref 150–400)
RBC: 3.58 MIL/uL — ABNORMAL LOW (ref 4.22–5.81)
RDW: 16.2 % — ABNORMAL HIGH (ref 11.5–15.5)
WBC: 8.5 10*3/uL (ref 4.0–10.5)
nRBC: 0 % (ref 0.0–0.2)

## 2019-07-05 LAB — BASIC METABOLIC PANEL
Anion gap: 9 (ref 5–15)
BUN: 7 mg/dL (ref 6–20)
CO2: 25 mmol/L (ref 22–32)
Calcium: 8.8 mg/dL — ABNORMAL LOW (ref 8.9–10.3)
Chloride: 109 mmol/L (ref 98–111)
Creatinine, Ser: 0.9 mg/dL (ref 0.61–1.24)
GFR calc Af Amer: >60 mL/min (ref >60)
GFR calc non Af Amer: >60 mL/min (ref >60)
Glucose, Bld: 162 mg/dL — ABNORMAL HIGH (ref 70–99)
Potassium: 3.4 mmol/L — ABNORMAL LOW (ref 3.5–5.1)
Sodium: 143 mmol/L (ref 135–145)

## 2019-07-06 LAB — VANCOMYCIN, TROUGH

## 2019-07-06 LAB — MISC LABCORP TEST (SEND OUT): Labcorp test code: 70328

## 2019-07-09 ENCOUNTER — Other Ambulatory Visit (HOSPITAL_COMMUNITY)
Admission: RE | Admit: 2019-07-09 | Discharge: 2019-07-09 | Disposition: A | Discharge status: Home / Self Care | Payer: Medicaid Other | Source: Other Acute Inpatient Hospital | Attending: Orthopedic Surgery | Admitting: Orthopedic Surgery

## 2019-07-09 DIAGNOSIS — L02416 Cutaneous abscess of left lower limb: Secondary | ICD-10-CM | POA: Diagnosis present

## 2019-07-09 LAB — CBC WITH DIFFERENTIAL/PLATELET
Abs Immature Granulocytes: 0.02 10*3/uL (ref 0.00–0.07)
Basophils Absolute: 0.1 10*3/uL (ref 0.0–0.1)
Basophils Relative: 1 %
Eosinophils Absolute: 0.3 10*3/uL (ref 0.0–0.5)
Eosinophils Relative: 4 %
HCT: 38.1 % — ABNORMAL LOW (ref 39.0–52.0)
Hemoglobin: 11.7 g/dL — ABNORMAL LOW (ref 13.0–17.0)
Immature Granulocytes: 0 %
Lymphocytes Relative: 36 %
Lymphs Abs: 2.9 10*3/uL (ref 0.7–4.0)
MCH: 30.6 pg (ref 26.0–34.0)
MCHC: 30.7 g/dL (ref 30.0–36.0)
MCV: 99.7 fL (ref 80.0–100.0)
Monocytes Absolute: 0.6 10*3/uL (ref 0.1–1.0)
Monocytes Relative: 8 %
Neutro Abs: 4.2 10*3/uL (ref 1.7–7.7)
Neutrophils Relative %: 51 %
Platelets: 407 10*3/uL — ABNORMAL HIGH (ref 150–400)
RBC: 3.82 MIL/uL — ABNORMAL LOW (ref 4.22–5.81)
RDW: 16.5 % — ABNORMAL HIGH (ref 11.5–15.5)
WBC: 8.1 10*3/uL (ref 4.0–10.5)
nRBC: 0 % (ref 0.0–0.2)

## 2019-07-09 LAB — BASIC METABOLIC PANEL
Anion gap: 10 (ref 5–15)
BUN: 10 mg/dL (ref 6–20)
CO2: 24 mmol/L (ref 22–32)
Calcium: 8.7 mg/dL — ABNORMAL LOW (ref 8.9–10.3)
Chloride: 111 mmol/L (ref 98–111)
Creatinine, Ser: 0.85 mg/dL (ref 0.61–1.24)
GFR calc Af Amer: >60 mL/min (ref >60)
GFR calc non Af Amer: >60 mL/min (ref >60)
Glucose, Bld: 114 mg/dL — ABNORMAL HIGH (ref 70–99)
Potassium: 3.5 mmol/L (ref 3.5–5.1)
Sodium: 145 mmol/L (ref 135–145)

## 2019-07-11 LAB — VANCOMYCIN, TROUGH

## 2019-07-11 LAB — MISC LABCORP TEST (SEND OUT): Labcorp test code: 70328

## 2020-09-18 ENCOUNTER — Other Ambulatory Visit: Payer: Self-pay

## 2020-09-18 ENCOUNTER — Encounter (HOSPITAL_COMMUNITY): Payer: Self-pay | Admitting: Emergency Medicine

## 2020-09-18 ENCOUNTER — Inpatient Hospital Stay (HOSPITAL_COMMUNITY)
Admission: EM | Admit: 2020-09-18 | Discharge: 2020-09-22 | DRG: 155 | Disposition: A | Discharge status: Home / Self Care | Payer: Medicaid Other | Attending: Family Medicine | Admitting: Family Medicine

## 2020-09-18 DIAGNOSIS — G609 Hereditary and idiopathic neuropathy, unspecified: Secondary | ICD-10-CM | POA: Diagnosis present

## 2020-09-18 DIAGNOSIS — Z794 Long term (current) use of insulin: Secondary | ICD-10-CM

## 2020-09-18 DIAGNOSIS — Z8614 Personal history of Methicillin resistant Staphylococcus aureus infection: Secondary | ICD-10-CM

## 2020-09-18 DIAGNOSIS — K1121 Acute sialoadenitis: Principal | ICD-10-CM | POA: Diagnosis present

## 2020-09-18 DIAGNOSIS — E1165 Type 2 diabetes mellitus with hyperglycemia: Secondary | ICD-10-CM | POA: Diagnosis present

## 2020-09-18 DIAGNOSIS — Z20822 Contact with and (suspected) exposure to covid-19: Secondary | ICD-10-CM | POA: Diagnosis present

## 2020-09-18 DIAGNOSIS — I10 Essential (primary) hypertension: Secondary | ICD-10-CM | POA: Diagnosis present

## 2020-09-18 DIAGNOSIS — K1120 Sialoadenitis, unspecified: Secondary | ICD-10-CM

## 2020-09-18 DIAGNOSIS — F17210 Nicotine dependence, cigarettes, uncomplicated: Secondary | ICD-10-CM | POA: Diagnosis present

## 2020-09-18 DIAGNOSIS — M869 Osteomyelitis, unspecified: Secondary | ICD-10-CM | POA: Diagnosis present

## 2020-09-18 DIAGNOSIS — K122 Cellulitis and abscess of mouth: Secondary | ICD-10-CM

## 2020-09-18 DIAGNOSIS — Z9114 Patient's other noncompliance with medication regimen: Secondary | ICD-10-CM

## 2020-09-18 DIAGNOSIS — J381 Polyp of vocal cord and larynx: Secondary | ICD-10-CM | POA: Diagnosis present

## 2020-09-18 DIAGNOSIS — Z79899 Other long term (current) drug therapy: Secondary | ICD-10-CM

## 2020-09-18 DIAGNOSIS — IMO0002 Reserved for concepts with insufficient information to code with codable children: Secondary | ICD-10-CM

## 2020-09-18 DIAGNOSIS — R61 Generalized hyperhidrosis: Secondary | ICD-10-CM | POA: Diagnosis present

## 2020-09-18 DIAGNOSIS — Z7982 Long term (current) use of aspirin: Secondary | ICD-10-CM

## 2020-09-18 HISTORY — DX: Essential (primary) hypertension: I10

## 2020-09-18 HISTORY — DX: Type 2 diabetes mellitus without complications: E11.9

## 2020-09-18 NOTE — ED Triage Notes (Signed)
Pt c/o left sided dental pain x 3 days, states he has swelling in his jaw today.

## 2020-09-18 NOTE — ED Provider Notes (Signed)
Emergency Medicine Provider Triage Evaluation Note  [name] , a 45 y.o. male  was evaluated in triage.  Pt complains of dental pain.  He states that over the past 5 to 7 days he has had worsening left-sided dental pain.  He states that he has not seen a dentist in "a while."  He states that his pain and swelling has been worsening.  No fevers..  Review of Systems  Positive: Dental pain Negative: Fevers  Physical Exam  BP (!) 157/114 (BP Location: Right Arm)   Pulse (!) 111   Temp 99 F (37.2 C) (Oral)   Resp (!) 24   SpO2 97%  Gen:   Awake, no distress   Resp:  Normal effort  MSK:   Moves extremities without difficulty  Other:  No trismus, no obvious uvular deviation, questionable left-sided submandibular edema.  Medical Decision Making  Medically screening exam initiated at 8:40 PM.  Appropriate orders placed.  [name] was informed that the remainder of the evaluation will be completed by another provider, this initial triage assessment does not replace that evaluation, and the importance of remaining in the ED until their evaluation is complete.     Cristina Gong, Cordelia Poche 09/18/20 2040    Terald Sleeper, MD 09/19/20 Aretha Parrot

## 2020-09-19 ENCOUNTER — Encounter (HOSPITAL_COMMUNITY): Payer: Self-pay | Admitting: Student

## 2020-09-19 ENCOUNTER — Emergency Department (HOSPITAL_COMMUNITY): Payer: Medicaid Other

## 2020-09-19 DIAGNOSIS — J381 Polyp of vocal cord and larynx: Secondary | ICD-10-CM | POA: Diagnosis present

## 2020-09-19 DIAGNOSIS — Z7982 Long term (current) use of aspirin: Secondary | ICD-10-CM | POA: Diagnosis not present

## 2020-09-19 DIAGNOSIS — R61 Generalized hyperhidrosis: Secondary | ICD-10-CM | POA: Diagnosis present

## 2020-09-19 DIAGNOSIS — I10 Essential (primary) hypertension: Secondary | ICD-10-CM

## 2020-09-19 DIAGNOSIS — M869 Osteomyelitis, unspecified: Secondary | ICD-10-CM | POA: Diagnosis present

## 2020-09-19 DIAGNOSIS — E1165 Type 2 diabetes mellitus with hyperglycemia: Secondary | ICD-10-CM

## 2020-09-19 DIAGNOSIS — G609 Hereditary and idiopathic neuropathy, unspecified: Secondary | ICD-10-CM | POA: Diagnosis present

## 2020-09-19 DIAGNOSIS — F17210 Nicotine dependence, cigarettes, uncomplicated: Secondary | ICD-10-CM | POA: Diagnosis present

## 2020-09-19 DIAGNOSIS — Z20822 Contact with and (suspected) exposure to covid-19: Secondary | ICD-10-CM | POA: Diagnosis present

## 2020-09-19 DIAGNOSIS — K1121 Acute sialoadenitis: Secondary | ICD-10-CM | POA: Diagnosis present

## 2020-09-19 DIAGNOSIS — K122 Cellulitis and abscess of mouth: Secondary | ICD-10-CM | POA: Diagnosis not present

## 2020-09-19 DIAGNOSIS — Z9114 Patient's other noncompliance with medication regimen: Secondary | ICD-10-CM | POA: Diagnosis not present

## 2020-09-19 DIAGNOSIS — Z794 Long term (current) use of insulin: Secondary | ICD-10-CM | POA: Diagnosis not present

## 2020-09-19 DIAGNOSIS — Z79899 Other long term (current) drug therapy: Secondary | ICD-10-CM | POA: Diagnosis not present

## 2020-09-19 DIAGNOSIS — IMO0002 Reserved for concepts with insufficient information to code with codable children: Secondary | ICD-10-CM

## 2020-09-19 DIAGNOSIS — Z8614 Personal history of Methicillin resistant Staphylococcus aureus infection: Secondary | ICD-10-CM | POA: Diagnosis not present

## 2020-09-19 LAB — HIV ANTIBODY (ROUTINE TESTING W REFLEX): HIV Screen 4th Generation wRfx: NONREACTIVE

## 2020-09-19 LAB — HEMOGLOBIN A1C
Hgb A1c MFr Bld: 11.8 % — ABNORMAL HIGH (ref 4.8–5.6)
Mean Plasma Glucose: 291.96 mg/dL

## 2020-09-19 LAB — CBC WITH DIFFERENTIAL/PLATELET
Abs Immature Granulocytes: 0.03 10*3/uL (ref 0.00–0.07)
Basophils Absolute: 0.1 10*3/uL (ref 0.0–0.1)
Basophils Relative: 0 %
Eosinophils Absolute: 0.1 10*3/uL (ref 0.0–0.5)
Eosinophils Relative: 1 %
HCT: 51.1 % (ref 39.0–52.0)
Hemoglobin: 17.5 g/dL — ABNORMAL HIGH (ref 13.0–17.0)
Immature Granulocytes: 0 %
Lymphocytes Relative: 21 %
Lymphs Abs: 2.4 10*3/uL (ref 0.7–4.0)
MCH: 30.9 pg (ref 26.0–34.0)
MCHC: 34.2 g/dL (ref 30.0–36.0)
MCV: 90.1 fL (ref 80.0–100.0)
Monocytes Absolute: 1 10*3/uL (ref 0.1–1.0)
Monocytes Relative: 8 %
Neutro Abs: 7.8 10*3/uL — ABNORMAL HIGH (ref 1.7–7.7)
Neutrophils Relative %: 70 %
Platelets: 214 10*3/uL (ref 150–400)
RBC: 5.67 MIL/uL (ref 4.22–5.81)
RDW: 12.5 % (ref 11.5–15.5)
WBC: 11.4 10*3/uL — ABNORMAL HIGH (ref 4.0–10.5)
nRBC: 0 % (ref 0.0–0.2)

## 2020-09-19 LAB — BASIC METABOLIC PANEL
Anion gap: 16 — ABNORMAL HIGH (ref 5–15)
BUN: 22 mg/dL — ABNORMAL HIGH (ref 6–20)
CO2: 22 mmol/L (ref 22–32)
Calcium: 9.5 mg/dL (ref 8.9–10.3)
Chloride: 97 mmol/L — ABNORMAL LOW (ref 98–111)
Creatinine, Ser: 1.19 mg/dL (ref 0.61–1.24)
GFR, Estimated: >60 mL/min (ref >60)
Glucose, Bld: 313 mg/dL — ABNORMAL HIGH (ref 70–99)
Potassium: 3.7 mmol/L (ref 3.5–5.1)
Sodium: 135 mmol/L (ref 135–145)

## 2020-09-19 LAB — GLUCOSE, CAPILLARY
Glucose-Capillary: 179 mg/dL — ABNORMAL HIGH (ref 70–99)
Glucose-Capillary: 178 mg/dL — ABNORMAL HIGH (ref 70–99)
Glucose-Capillary: 199 mg/dL — ABNORMAL HIGH (ref 70–99)
Glucose-Capillary: 231 mg/dL — ABNORMAL HIGH (ref 70–99)
Glucose-Capillary: 226 mg/dL — ABNORMAL HIGH (ref 70–99)
Glucose-Capillary: 198 mg/dL — ABNORMAL HIGH (ref 70–99)

## 2020-09-19 LAB — MAGNESIUM: Magnesium: 1.9 mg/dL (ref 1.7–2.4)

## 2020-09-19 LAB — RESP PANEL BY RT-PCR (FLU A&B, COVID) ARPGX2
Influenza A by PCR: NEGATIVE
Influenza B by PCR: NEGATIVE
SARS Coronavirus 2 by RT PCR: NEGATIVE

## 2020-09-19 LAB — PHOSPHORUS: Phosphorus: 4.4 mg/dL (ref 2.5–4.6)

## 2020-09-19 LAB — CBG MONITORING, ED: Glucose-Capillary: 208 mg/dL — ABNORMAL HIGH (ref 70–99)

## 2020-09-19 LAB — MRSA NEXT GEN BY PCR, NASAL: MRSA by PCR Next Gen: NOT DETECTED

## 2020-09-19 MED ORDER — HYDRALAZINE HCL 20 MG/ML IJ SOLN: 5.0000 mg | Freq: Four times a day (QID) | INTRAMUSCULAR | Status: DC | PRN

## 2020-09-19 MED ORDER — HYDROMORPHONE HCL 1 MG/ML IJ SOLN
0.5000 mg | Freq: Once | INTRAMUSCULAR | Status: AC
  Administered 2020-09-19: 0.5 mg via INTRAVENOUS
  Filled 2020-09-19: qty 1

## 2020-09-19 MED ORDER — HYDROMORPHONE HCL 1 MG/ML IJ SOLN
1.0000 mg | Freq: Once | INTRAMUSCULAR | Status: AC
  Administered 2020-09-19: 1 mg via INTRAVENOUS
  Filled 2020-09-19: qty 1

## 2020-09-19 MED ORDER — PIPERACILLIN-TAZOBACTAM 3.375 G IVPB
3.3750 g | Freq: Three times a day (TID) | INTRAVENOUS | Status: DC
  Administered 2020-09-19 – 2020-09-22 (×8): 3.375 g via INTRAVENOUS
  Filled 2020-09-19 (×12): qty 50

## 2020-09-19 MED ORDER — LACTATED RINGERS IV SOLN: INTRAVENOUS | Status: DC

## 2020-09-19 MED ORDER — VANCOMYCIN HCL 2000 MG/400ML IV SOLN
2000.0000 mg | Freq: Once | INTRAVENOUS | Status: AC
  Administered 2020-09-19: 2000 mg via INTRAVENOUS
  Filled 2020-09-19: qty 400

## 2020-09-19 MED ORDER — OXYCODONE HCL 5 MG PO TABS
5.0000 mg | ORAL_TABLET | ORAL | Status: DC | PRN
  Administered 2020-09-19: 5 mg via ORAL
  Administered 2020-09-20 – 2020-09-22 (×4): 10 mg via ORAL
  Filled 2020-09-19 (×5): qty 2

## 2020-09-19 MED ORDER — ATORVASTATIN CALCIUM 40 MG PO TABS
40.0000 mg | ORAL_TABLET | Freq: Every day | ORAL | Status: DC
  Administered 2020-09-19 – 2020-09-22 (×4): 40 mg via ORAL
  Filled 2020-09-19 (×4): qty 1

## 2020-09-19 MED ORDER — PANTOPRAZOLE SODIUM 40 MG PO TBEC
40.0000 mg | DELAYED_RELEASE_TABLET | Freq: Every day | ORAL | Status: DC
  Administered 2020-09-19 – 2020-09-22 (×4): 40 mg via ORAL
  Filled 2020-09-19 (×4): qty 1

## 2020-09-19 MED ORDER — VANCOMYCIN HCL 1000 MG/200ML IV SOLN
1000.0000 mg | Freq: Two times a day (BID) | INTRAVENOUS | Status: DC
  Administered 2020-09-19: 1000 mg via INTRAVENOUS
  Filled 2020-09-19 (×2): qty 200

## 2020-09-19 MED ORDER — IOHEXOL 300 MG/ML  SOLN
75.0000 mL | Freq: Once | INTRAMUSCULAR | Status: AC | PRN
  Administered 2020-09-19: 75 mL via INTRAVENOUS

## 2020-09-19 MED ORDER — INSULIN ASPART 100 UNIT/ML IJ SOLN
0.0000 [IU] | Freq: Three times a day (TID) | INTRAMUSCULAR | Status: DC
  Administered 2020-09-19: 5 [IU] via SUBCUTANEOUS
  Administered 2020-09-20: 8 [IU] via SUBCUTANEOUS

## 2020-09-19 MED ORDER — SODIUM CHLORIDE 0.9 % IV BOLUS
1000.0000 mL | Freq: Once | INTRAVENOUS | Status: AC
  Administered 2020-09-19: 1000 mL via INTRAVENOUS

## 2020-09-19 MED ORDER — DEXAMETHASONE SODIUM PHOSPHATE 10 MG/ML IJ SOLN
8.0000 mg | Freq: Three times a day (TID) | INTRAMUSCULAR | Status: DC
  Administered 2020-09-19 – 2020-09-21 (×7): 8 mg via INTRAVENOUS
  Filled 2020-09-19 (×8): qty 0.8

## 2020-09-19 MED ORDER — INSULIN GLARGINE 100 UNIT/ML ~~LOC~~ SOLN
20.0000 [IU] | Freq: Every day | SUBCUTANEOUS | Status: DC
  Administered 2020-09-19 – 2020-09-20 (×2): 20 [IU] via SUBCUTANEOUS
  Filled 2020-09-19 (×2): qty 0.2

## 2020-09-19 MED ORDER — OXYCODONE HCL 5 MG PO TABS
5.0000 mg | ORAL_TABLET | Freq: Four times a day (QID) | ORAL | Status: DC | PRN
  Administered 2020-09-19: 5 mg via ORAL
  Filled 2020-09-19: qty 1

## 2020-09-19 MED ORDER — INSULIN ASPART 100 UNIT/ML IJ SOLN
0.0000 [IU] | INTRAMUSCULAR | Status: DC
  Administered 2020-09-19: 5 [IU] via SUBCUTANEOUS
  Administered 2020-09-19 (×2): 3 [IU] via SUBCUTANEOUS

## 2020-09-19 MED ORDER — AMLODIPINE BESYLATE 5 MG PO TABS
5.0000 mg | ORAL_TABLET | Freq: Every day | ORAL | Status: DC
  Administered 2020-09-19 – 2020-09-22 (×4): 5 mg via ORAL
  Filled 2020-09-19 (×4): qty 1

## 2020-09-19 MED ORDER — INSULIN ASPART 100 UNIT/ML IJ SOLN
5.0000 [IU] | Freq: Three times a day (TID) | INTRAMUSCULAR | Status: DC
  Administered 2020-09-19 – 2020-09-21 (×5): 5 [IU] via SUBCUTANEOUS

## 2020-09-19 MED ORDER — FENTANYL CITRATE (PF) 100 MCG/2ML IJ SOLN: 50.0000 ug | Freq: Once | INTRAMUSCULAR | Status: DC

## 2020-09-19 MED ORDER — SENNOSIDES-DOCUSATE SODIUM 8.6-50 MG PO TABS
2.0000 | ORAL_TABLET | Freq: Every day | ORAL | Status: DC
  Filled 2020-09-19 (×3): qty 2

## 2020-09-19 MED ORDER — ACETAMINOPHEN 325 MG PO TABS
650.0000 mg | ORAL_TABLET | Freq: Four times a day (QID) | ORAL | Status: DC | PRN
  Administered 2020-09-19 (×2): 650 mg via ORAL
  Filled 2020-09-19 (×2): qty 2

## 2020-09-19 MED ORDER — SERTRALINE HCL 25 MG PO TABS
50.0000 mg | ORAL_TABLET | Freq: Every day | ORAL | Status: DC
  Administered 2020-09-19 – 2020-09-22 (×4): 50 mg via ORAL
  Filled 2020-09-19 (×3): qty 2
  Filled 2020-09-19: qty 1
  Filled 2020-09-19: qty 2

## 2020-09-19 MED ORDER — HYDROMORPHONE HCL 1 MG/ML IJ SOLN
1.0000 mg | INTRAMUSCULAR | Status: DC | PRN
  Administered 2020-09-19 – 2020-09-22 (×12): 1 mg via INTRAVENOUS
  Filled 2020-09-19 (×12): qty 1

## 2020-09-19 MED ORDER — SODIUM CHLORIDE 0.9 % IV SOLN
3.0000 g | Freq: Once | INTRAVENOUS | Status: AC
  Administered 2020-09-19: 3 g via INTRAVENOUS
  Filled 2020-09-19: qty 8

## 2020-09-19 MED ORDER — ENOXAPARIN SODIUM 40 MG/0.4ML IJ SOSY
40.0000 mg | PREFILLED_SYRINGE | INTRAMUSCULAR | Status: DC
  Administered 2020-09-19 – 2020-09-21 (×3): 40 mg via SUBCUTANEOUS
  Filled 2020-09-19 (×3): qty 0.4

## 2020-09-19 NOTE — Plan of Care (Signed)

## 2020-09-19 NOTE — ED Provider Notes (Signed)
[facility] EMERGENCY DEPARTMENT Provider Note   CSN: [id number] Arrival date & time: 09/18/20  2005     History Chief Complaint  Patient presents with   Dental Pain    CANNEN DUPRAS is a 45 y.o. male with history of hypertension and diabetes mellitus who presents to the emergency department with complaints of intraoral pain over the past 2 days.  Patient states he is having pain and swelling to the left side below his tongue that is constant, progressively worsening, and without alleviating factors.  He states he started to feel like the area below his chin is swollen.  At times it hurts to swallow.  He denies fever, chills, vomiting, dyspnea, or recent injury.  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     No family history on file.  Social History   Tobacco Use   Smoking status: Every Day    Pack years: 0.00   Smokeless tobacco: Never  Substance Use Topics   Alcohol use: Yes    Alcohol/week: 0.0 standard drinks   Drug use: No    Home Medications Prior to Admission medications   Medication Sig Start Date End Date Taking? Authorizing Provider  acetaminophen (TYLENOL) 325 MG tablet Take 2 tablets (650 mg total) by mouth every 6 (six) hours as needed. 01/25/19   Derwood Kaplan, MD  aspirin 81 MG tablet Take 81 mg by mouth daily.    [provider]  atorvastatin (LIPITOR) 40 MG tablet TAKE 1 TABLET BY MOUTH EVERYDAY AT BEDTIME 04/01/18   [provider]  celecoxib (CELEBREX) 200 MG capsule Take 200 mg by mouth 2 (two) times daily.    [provider]  chlorthalidone (HYGROTON) 25 MG tablet TAKE 1  TABLET BY MOUTH EVERY DAY IN THE MORNING 05/05/18   [provider]  cyanocobalamin 500 MCG tablet Take 500 mcg by mouth daily.    [provider]  doxycycline (VIBRA-TABS) 100 MG tablet Take 1 tablet (100 mg total) by mouth 2 (two) times daily. 09/15/18   Felecia Shelling, DPM  DULoxetine (CYMBALTA) 60 MG capsule Take 60 mg by mouth daily.    [provider]  ferrous sulfate 325 (65 FE) MG EC tablet Take 325 mg by mouth daily with breakfast.    [provider]  gabapentin (NEURONTIN) 600 MG tablet Take 600 mg by mouth 3 (three) times daily.    [provider]  Insulin Glargine (LANTUS SOLOSTAR) 100 UNIT/ML Solostar Pen Inject into the skin. 03/15/16   [provider]  lisinopril (PRINIVIL,ZESTRIL) 40 MG tablet TAKE 1 TABLET BY MOUTH EVERY DAY IN THE MORNING 05/05/18   [provider]  metFORMIN (GLUCOPHAGE) 500 MG tablet Take by mouth. 03/15/16   [provider]  naproxen (NAPROSYN) 500 MG tablet TAKE ONE (1) TABLET BY MOUTH TWO (2) TIMES DAILY WITH FOOD 03/23/16   [provider]  omeprazole (PRILOSEC) 20 MG capsule Take 20 mg by mouth daily.    [provider]  oxycodone (OXY-IR) 5 MG capsule Take 10 mg by mouth every 3 (three) hours as needed.    [provider]  Oxycodone HCl 10 MG TABS Take one tablet by mouth every 4 hours as needed for pain 04/05/14   Reed, Tiffany L, DO  OxyCODONE HCl ER (OXYCONTIN) 30 MG T12A Take one tablet by mouth every 12 hours for pain. Do not crush 04/05/14   Reed, Tiffany L, DO  pregabalin (LYRICA) 150 MG capsule Take by mouth. 02/23/17   [provider]  sertraline (ZOLOFT) 50 MG tablet TAKE 1 TABLET BY MOUTH EVERY DAY IN THE MORNING 02/10/18   [provider]  simvastatin (ZOCOR) 40 MG tablet Take by mouth. 03/15/16   [provider]  traMADol (ULTRAM) 50 MG tablet Take 50 mg by mouth 3 (three) times daily as needed. 07/09/18   [provider]     Allergies    Patient has no known allergies.  Review of Systems   Review of Systems  Constitutional:  Negative for chills and fever.  HENT:  Positive for dental problem.   Respiratory:  Negative for shortness of breath.   Cardiovascular:  Negative for chest pain.  Gastrointestinal:  Negative for vomiting.  Musculoskeletal:  Positive for neck pain. Negative for neck stiffness.  Neurological:  Negative for syncope.  All other systems reviewed and are negative.  Physical Exam Updated Vital Signs BP (!) 195/137 (BP Location: Left Arm)   Pulse (!) 102   Temp 99 F (37.2 C) (Oral)   Resp 20   SpO2 99%   Physical Exam Vitals and nursing note reviewed.  Constitutional:      General: He is not in acute distress.    Appearance: He is well-developed. He is not toxic-appearing.  HENT:     Head: Normocephalic and atraumatic.     Mouth/Throat:     Comments: Patient has swelling with tenderness to palpation to the left sublingual gland region.  No obvious palpable stone present. Tolerating own secretions without difficulty.  No trismus.  Airway is patent.  Patient does have some tenderness and swelling present to submandibular space. Normal phonation.  Trachea is midline.  No overlying skin changes. Eyes:     General:        Right eye: No discharge.        Left eye: No discharge.     Conjunctiva/sclera: Conjunctivae normal.  Cardiovascular:     Rate and Rhythm: Regular rhythm. Tachycardia present.  Pulmonary:     Effort: Pulmonary effort is normal. No respiratory distress.     Breath sounds: Normal breath sounds. No wheezing, rhonchi or rales.  Abdominal:     General: There is no distension.     Palpations: Abdomen is soft.     Tenderness: There is no abdominal tenderness.  Musculoskeletal:     Cervical back: Neck supple.  Skin:    General: Skin is warm and dry.     Findings: No rash.  Neurological:     Mental Status: He is alert.     Comments: Clear speech.    Psychiatric:        Behavior: Behavior normal.    ED Results / Procedures / Treatments   Labs (all labs ordered are listed, but only abnormal results are displayed) Labs Reviewed - No data to display  EKG None  Radiology CT Soft Tissue Neck W Contrast  Result Date: 09/19/2020 CLINICAL DATA:  Initial evaluation for acute left-sided neck pain, swelling. EXAM: CT NECK WITH CONTRAST TECHNIQUE: Multidetector CT imaging of the neck was performed using the standard protocol following the bolus administration  of intravenous contrast. CONTRAST:  89mL OMNIPAQUE IOHEXOL 300 MG/ML  SOLN COMPARISON:  None. FINDINGS: Pharynx and larynx: Palatine tonsils symmetric and within normal limits. No discrete tonsillar or peritonsillar abscess. Remainder of the oropharynx and nasopharynx within normal limits. No retropharyngeal collection or swelling. Epiglottis normal. Remainder of the hypopharynx and supraglottic larynx within normal limits. There is a possible 3 mm polypoid projection extending from the right true vocal cord near the anterior commissure (series 3, image 90 on axial sequence, series 7, image 73 on coronal sequence). Glottis otherwise unremarkable. Subglottic airway patent and clear. Salivary glands: Parotid glands within normal limits. Right submandibular gland normal. On the left, there is slight asymmetric enlargement with heterogeneity of the left submandibular gland, suspicious for acute sialoadenitis. The left submandibular duct is mildly dilated. Serpiginous hypodensities at the level of the left floor of mouth and sublingual space favored to reflect tortuosity of the dilated left submandibular duct, although associated abscess formation is difficult to exclude. Area of involvement measures approximately 2.4 cm in greatest dimension (series 7, image 49). The left sublingual gland appears to be enlarged and inflamed as well. The oral tongue appears somewhat lifted in the oral cavity. Associated  swelling with inflammatory stranding noted within the adjacent left greater than right submandibular spaces and submental region, consistent with regional cellulitis. Thyroid: Normal. Lymph nodes: Mildly prominent level I a and IB nodes noted, largest of which seen at left level IB and measures 8 mm. Findings presumably reactive. No other enlarged or pathologic adenopathy within the neck. Vascular: Normal intravascular enhancement seen throughout the neck. Limited intracranial: Unremarkable. Visualized orbits: Unremarkable. Mastoids and visualized paranasal sinuses: Paranasal sinuses are clear. Mastoid air cells and middle ear cavities are well pneumatized and free of fluid. Skeleton: No acute osseous abnormality. No discrete or worrisome osseous lesions. Torus mandibularis noted. Upper chest: Visualized upper chest demonstrates no acute finding. Partially visualized lungs are largely clear. Other: None. IMPRESSION: 1. Findings consistent with acute left submandibular/sublingual sialoadenitis with associated regional cellulitis within the adjacent left greater than right submandibular spaces and submental region. Mild dilatation of the left submandibular duct without obstructive stone. Serpiginous hypodensities at the left sublingual space favored to reflect ductal tortuosity, although associated abscess formation is difficult to exclude. Additionally, there is mild mass effect about the left sublingual space with lifting of the oral tongue, suggesting that this patient could potentially be at risk for airway compromise. Close clinical monitoring recommended. 2. Mildly prominent left-sided cervical adenopathy as above, presumably reactive. 3. Question 3 mm polypoid projection extending from the right true vocal cord near the anterior commissure, which could reflect a small polyp or mass. ENT referral for direct visualization suggested. Electronically Signed   By: Rise Mu M.D.   On: 09/19/2020 03:37     Procedures Procedures   Medications Ordered in ED Medications  enoxaparin (LOVENOX) injection 40 mg (has no administration in time range)  atorvastatin (LIPITOR) tablet 40 mg (has no administration in time range)  pantoprazole (PROTONIX) EC tablet 40 mg (has no administration in time range)  sertraline (ZOLOFT) tablet 50 mg (has no administration in time range)  lactated ringers infusion ( Intravenous New Bag/Given 09/19/20 0541)  insulin aspart (novoLOG) injection 0-15 Units (5 Units Subcutaneous Given 09/19/20 0510)  acetaminophen (TYLENOL) tablet 650 mg (has no administration in time range)  oxyCODONE (Oxy IR/ROXICODONE) immediate release tablet 5 mg (5 mg Oral Given 09/19/20 0528)  HYDROmorphone (DILAUDID) injection 1 mg (has no administration in time range)  senna-docusate (Senokot-S) tablet 2 tablet (has no administration in time range)  vancomycin (VANCOREADY) IVPB 2000 mg/400 mL (2,000 mg Intravenous New Bag/Given 09/19/20 0541)  vancomycin (VANCOREADY) IVPB 1000 mg/200 mL (has no administration in time range)  HYDROmorphone (DILAUDID) injection 0.5 mg (0.5 mg Intravenous Given 09/19/20 0110)  sodium chloride 0.9 % bolus 1,000 mL (0 mLs Intravenous Stopped 09/19/20 0438)  iohexol (OMNIPAQUE) 300 MG/ML solution 75 mL (75 mLs Intravenous Contrast Given 09/19/20 0256)  HYDROmorphone (DILAUDID) injection 1 mg (1 mg Intravenous Given 09/19/20 0356)  Ampicillin-Sulbactam (UNASYN) 3 g in sodium chloride 0.9 % 100 mL IVPB (0 g Intravenous Stopped 09/19/20 [id number])    ED Course  I have reviewed the triage vital signs and the nursing notes.  Pertinent labs & imaging results that were available during my care of the patient were reviewed by me and considered in my medical decision making (see chart for details).    MDM Rules/Calculators/A&P                          Patient presents to the ED with complaints of pain/swelling to below the left tongue.  Patient does have some swelling and  tenderness to palpation to the left sublingual gland space as well as some submandibular tenderness and swelling noted. Plan for labs & CT soft tissue neck.   Additional history obtained:  Additional history obtained from chart review & nursing note review.   Lab Tests:  I Ordered, reviewed, and interpreted labs, which included:  CBC: mild leukocytosis.  BMP: hyperglycemia.   Imaging Studies ordered:  I ordered imaging studies which included CT soft tissue neck, I independently reviewed, formal radiology impression shows:  1. Findings consistent with acute left submandibular/sublingual sialoadenitis with associated regional cellulitis within the adjacent left greater than right submandibular spaces and submental region. Mild dilatation of the left submandibular duct without obstructive stone. Serpiginous hypodensities at the left sublingual space favored to reflect ductal tortuosity, although associated abscess formation is difficult to exclude. Additionally, there is mild mass effect about the left sublingual space with lifting of the oral tongue, suggesting that this patient could potentially be at risk for airway compromise. Close clinical monitoring recommended. 2. Mildly prominent left-sided cervical adenopathy as above, presumably reactive. 3. Question 3 mm polypoid projection extending from the right true vocal cord near the anterior commissure, which could reflect a small polyp or mass. ENT referral for direct visualization suggested  ED Course:  Patient protecting his airway, will discuss w/ ENT.   04:06: CONSULT: Discussed with Dr. Suszanne Connerseoh, on call for ENT, can give unasyn or clindamycin, will see patient in consultation this AM, admit to hospitalist service.   Unasyn ordered, consult placed to hospitalist service.   04:21: CONSULT: Discussed with hospitalist Dr. Loney Lohathore- accepts admission.   Findings and plan of care discussed with supervising physician Dr. Eudelia Bunchardama who is in agreement.   Portions of this note were generated with Scientist, clinical (histocompatibility and immunogenetics)Dragon dictation software. Dictation errors may occur despite best attempts at proofreading.  Final Clinical Impression(s) / ED Diagnoses Final diagnoses:  Sialadenitis  Cellulitis of submandibular region    Rx / DC Orders ED Discharge Orders     None        Cherly Andersonetrucelli, Jiaire Rosebrook R, PA-C 09/19/20 0548    Nira Connardama, Pedro Eduardo, MD 09/27/20 [phone]

## 2020-09-19 NOTE — Progress Notes (Signed)
Called by nurse about patient having worsening swelling of his tongue. On examination, patient's tongue is mildly swollen but able to protrude out of his mouth. His submandibular area, however is significantly more swollen. No fluctuance noted one exam. No erythema. Airway is clear and protected. Discussed with Dr. Suszanne Conners who recommended starting IV Decadron 8 mg TID to help with swelling. He also recommended to keep Vancomycin IV. Information discussed with patient's nurse via telephone.  Jacquelin Hawking, MD [facility] 09/19/2020, 6:35 PM

## 2020-09-19 NOTE — ED Notes (Signed)
Pt reports hx of HTN and takes HCTZ at home. Pt states he has not taken his HTN meds in approx 2 days d/t dental pain.

## 2020-09-19 NOTE — Progress Notes (Signed)
PROGRESS NOTE    [name]  ZOX:[id number] DOB: 12/05/1975 DOA: 09/18/2020 PCP: Sherron Mondayejan-Sie, S Ahmed, MD   Brief Narrative: [name] is a [age] y.o. male with a history of diabetes mellitus, hypertension. Patient presented secondary to worsening jaw pain and found to have evidence of acute sialoadenitis with cellulitis. Started on IV antibiotics. ENT consulted with recommendations for continued antibiotics.   Assessment & Plan:   Principal Problem:   Acute sialoadenitis Active Problems:   Diabetes mellitus type 2, uncontrolled (HCC)   Primary hypertension   Acute left submandibular/sublingual sialoadenitis Cellulitis CT scan significant for sialoadenitis with associated regional cellulitis. No significant cellulitis on clinical exam. Significant pain. ENT consulted and no role for surgery and with recommendations for continued antibiotics. -Continue Zosyn -Discontinue Vancomycin -Follow-up blood cultures  Diabetes mellitus, type 2 Uncontrolled with hyperglycemia. Patient is on Lantus, Marcelline DeistFarxiga, Ozempic as an outpatient. Hemoglobin A1C of 11.8% . Started on SSI while inpatient -Start Lantus 20 units (20% decrease) -Switch to SSI qAC -Start Novolog 5 units TID with meals  AKI Ruled out. Does not meet criteria for AKI.  Primary hypertension -Continue amlodipine  History of LE osteomyelitis Not on antibiotics. Follows with ID.  Vocal cord polyp 3 mm. Per ENT will need surgical removal which can be arranged as an outpatient.   DVT prophylaxis: Lovenox Code Status:   Code Status: Full Code Family Communication: None at bedside Disposition Plan: Discharge likely home pending ENT recommendations, transition to oral antibiotics   Consultants:  ENT  Procedures:  None  Antimicrobials: Zosyn Vancomycin    Subjective: Jaw pain. No other concerns.  Objective: Vitals:   09/19/20 0045 09/19/20 0227 09/19/20 0400 09/19/20 0618  BP: (!) 195/137 (!)  170/124  (!) 149/110  Pulse:  (!) 104  90  Resp:  16  15  Temp:    98.3 F (36.8 C)  TempSrc:    Oral  SpO2:  98%  95%  Weight:   108.9 kg 103.6 kg  Height:   5\' 10"  (1.778 m) 5\' 10"  (1.778 m)    Intake/Output Summary (Last 24 hours) at 09/19/2020 0756 Last data filed at 09/19/2020 0511 Gross per 24 hour  Intake 0 ml  Output --  Net 0 ml   Filed Weights   09/19/20 0400 09/19/20 0618  Weight: 108.9 kg 103.6 kg    Examination:  General exam: Appears calm and comfortable Respiratory system: Clear to auscultation. Respiratory effort normal. Cardiovascular system: S1 & S2 heard, RRR. No murmurs, rubs, gallops or clicks. Gastrointestinal system: Abdomen is nondistended, soft and nontender. No organomegaly or masses felt. Normal bowel sounds heard. Central nervous system: Alert and oriented. No focal neurological deficits. Musculoskeletal: No edema. No calf tenderness Skin: No cyanosis. No rashes Psychiatry: Judgement and insight appear normal. Mood & affect appropriate.     Data Reviewed: I have personally reviewed following labs and imaging studies  CBC Lab Results  Component Value Date   WBC 11.4 (H) 09/19/2020   RBC 5.67 09/19/2020   HGB 17.5 (H) 09/19/2020   HCT 51.1 09/19/2020   MCV 90.1 09/19/2020   MCH 30.9 09/19/2020   PLT 214 09/19/2020   MCHC 34.2 09/19/2020   RDW 12.5 09/19/2020   LYMPHSABS 2.4 09/19/2020   MONOABS 1.0 09/19/2020   EOSABS 0.1 09/19/2020   BASOSABS 0.1 09/19/2020     Last metabolic panel Lab Results  Component Value Date   NA 135 09/19/2020   K 3.7 09/19/2020   CL  97 (L) 09/19/2020   CO2 22 09/19/2020   BUN 22 (H) 09/19/2020   CREATININE 1.19 09/19/2020   GLUCOSE 313 (H) 09/19/2020   GFRNONAA >60 09/19/2020   GFRAA >60 07/09/2019   CALCIUM 9.5 09/19/2020   PHOS 4.4 09/19/2020   PROT 7.1 01/25/2019   ALBUMIN 4.1 01/25/2019   BILITOT 0.8 01/25/2019   ALKPHOS 108 01/25/2019   AST 18 01/25/2019   ALT 34 01/25/2019   ANIONGAP  16 (H) 09/19/2020    CBG (last 3)  Recent Labs    09/19/20 0458 09/19/20 0636  GLUCAP 208* 179*     GFR: Estimated Creatinine Clearance: 97.5 mL/min (by C-G formula based on SCr of 1.19 mg/dL).  Coagulation Profile: No results for input(s): INR, PROTIME in the last 168 hours.  Recent Results (from the past 240 hour(s))  Resp Panel by RT-PCR (Flu A&B, Covid) Nasopharyngeal Swab     Status: None   Collection Time: 09/19/20  4:12 AM   Specimen: Nasopharyngeal Swab; Nasopharyngeal(NP) swabs in vial transport medium  Result Value Ref Range Status   SARS Coronavirus 2 by RT PCR NEGATIVE NEGATIVE Final    Comment: (NOTE) SARS-CoV-2 target nucleic acids are NOT DETECTED.  The SARS-CoV-2 RNA is generally detectable in upper respiratory specimens during the acute phase of infection. The lowest concentration of SARS-CoV-2 viral copies this assay can detect is 138 copies/mL. A negative result does not preclude SARS-Cov-2 infection and should not be used as the sole basis for treatment or other patient management decisions. A negative result may occur with  improper specimen collection/handling, submission of specimen other than nasopharyngeal swab, presence of viral mutation(s) within the areas targeted by this assay, and inadequate number of viral copies(<138 copies/mL). A negative result must be combined with clinical observations, patient history, and epidemiological information. The expected result is Negative.  Fact Sheet for Patients:  BloggerCourse.com  Fact Sheet for Healthcare Providers:  SeriousBroker.it  This test is no t yet approved or cleared by the [place] FDA and  has been authorized for detection and/or diagnosis of SARS-CoV-2 by FDA under an Emergency Use Authorization (EUA). This EUA will remain  in effect (meaning this test can be used) for the duration of the COVID-19 declaration under Section 564(b)(1)  of the Act, 21 U.S.C.section 360bbb-3(b)(1), unless the authorization is terminated  or revoked sooner.       Influenza A by PCR NEGATIVE NEGATIVE Final   Influenza B by PCR NEGATIVE NEGATIVE Final    Comment: (NOTE) The Xpert Xpress SARS-CoV-2/FLU/RSV plus assay is intended as an aid in the diagnosis of influenza from Nasopharyngeal swab specimens and should not be used as a sole basis for treatment. Nasal washings and aspirates are unacceptable for Xpert Xpress SARS-CoV-2/FLU/RSV testing.  Fact Sheet for Patients: BloggerCourse.com  Fact Sheet for Healthcare Providers: SeriousBroker.it  This test is not yet approved or cleared by the [place] FDA and has been authorized for detection and/or diagnosis of SARS-CoV-2 by FDA under an Emergency Use Authorization (EUA). This EUA will remain in effect (meaning this test can be used) for the duration of the COVID-19 declaration under Section 564(b)(1) of the Act, 21 U.S.C. section 360bbb-3(b)(1), unless the authorization is terminated or revoked.  Performed at [facility], [address] [place], [place] [address]         Radiology Studies: CT Soft Tissue Neck W Contrast  Result Date: 09/19/2020 CLINICAL DATA:  Initial evaluation for acute left-sided neck pain, swelling. EXAM:  CT NECK WITH CONTRAST TECHNIQUE: Multidetector CT imaging of the neck was performed using the standard protocol following the bolus administration of intravenous contrast. CONTRAST:  44mL OMNIPAQUE IOHEXOL 300 MG/ML  SOLN COMPARISON:  None. FINDINGS: Pharynx and larynx: Palatine tonsils symmetric and within normal limits. No discrete tonsillar or peritonsillar abscess. Remainder of the oropharynx and nasopharynx within normal limits. No retropharyngeal collection or swelling. Epiglottis normal. Remainder of the hypopharynx and supraglottic larynx within normal limits. There is a possible 3 mm  polypoid projection extending from the right true vocal cord near the anterior commissure (series 3, image 90 on axial sequence, series 7, image 73 on coronal sequence). Glottis otherwise unremarkable. Subglottic airway patent and clear. Salivary glands: Parotid glands within normal limits. Right submandibular gland normal. On the left, there is slight asymmetric enlargement with heterogeneity of the left submandibular gland, suspicious for acute sialoadenitis. The left submandibular duct is mildly dilated. Serpiginous hypodensities at the level of the left floor of mouth and sublingual space favored to reflect tortuosity of the dilated left submandibular duct, although associated abscess formation is difficult to exclude. Area of involvement measures approximately 2.4 cm in greatest dimension (series 7, image 49). The left sublingual gland appears to be enlarged and inflamed as well. The oral tongue appears somewhat lifted in the oral cavity. Associated swelling with inflammatory stranding noted within the adjacent left greater than right submandibular spaces and submental region, consistent with regional cellulitis. Thyroid: Normal. Lymph nodes: Mildly prominent level I a and IB nodes noted, largest of which seen at left level IB and measures 8 mm. Findings presumably reactive. No other enlarged or pathologic adenopathy within the neck. Vascular: Normal intravascular enhancement seen throughout the neck. Limited intracranial: Unremarkable. Visualized orbits: Unremarkable. Mastoids and visualized paranasal sinuses: Paranasal sinuses are clear. Mastoid air cells and middle ear cavities are well pneumatized and free of fluid. Skeleton: No acute osseous abnormality. No discrete or worrisome osseous lesions. Torus mandibularis noted. Upper chest: Visualized upper chest demonstrates no acute finding. Partially visualized lungs are largely clear. Other: None. IMPRESSION: 1. Findings consistent with acute left  submandibular/sublingual sialoadenitis with associated regional cellulitis within the adjacent left greater than right submandibular spaces and submental region. Mild dilatation of the left submandibular duct without obstructive stone. Serpiginous hypodensities at the left sublingual space favored to reflect ductal tortuosity, although associated abscess formation is difficult to exclude. Additionally, there is mild mass effect about the left sublingual space with lifting of the oral tongue, suggesting that this patient could potentially be at risk for airway compromise. Close clinical monitoring recommended. 2. Mildly prominent left-sided cervical adenopathy as above, presumably reactive. 3. Question 3 mm polypoid projection extending from the right true vocal cord near the anterior commissure, which could reflect a small polyp or mass. ENT referral for direct visualization suggested. Electronically Signed   By: Rise Mu M.D.   On: 09/19/2020 03:37        Scheduled Meds:  amLODipine  5 mg Oral Daily   atorvastatin  40 mg Oral Daily   enoxaparin (LOVENOX) injection  40 mg Subcutaneous Q24H   insulin aspart  0-15 Units Subcutaneous Q4H   pantoprazole  40 mg Oral Daily   senna-docusate  2 tablet Oral QHS   sertraline  50 mg Oral Daily   Continuous Infusions:  lactated ringers 100 mL/hr at 09/19/20 0633   piperacillin-tazobactam (ZOSYN)  IV     vancomycin       LOS: 0 days  Jacquelin Hawking, MD [facility] 09/19/2020, 7:56 AM  If 7PM-7AM, please contact night-coverage [email or web address]

## 2020-09-19 NOTE — H&P (Addendum)
History and Physical  [name] ZOX:[id number] DOB: [birth year] DOA: 09/18/2020  Referring physician: Harvie HeckSamantha Petrucelli, PA-EDP  PCP: Sherron Mondayejan-Sie, S Ahmed, MD  Outpatient Specialists: ID Patient coming from: Home.  Chief Complaint: Left neck and left cheek swelling and pain.  HPI: [name] is a 45 y.o. male with medical history significant for MRSA infection, left lower extremity osteomyelitis in 2015, uncontrolled type 2 diabetes, essential hypertension, who [facility] from home due to worsening pain and edema of left-sided cheek and neck.  Noted swelling 3 days ago, associated with progressive and worsening discomfort.  Has been self-medicating at home with oral rinses/gargles and over-the-counter NSAIDs.  Pain is exacerbated by using the left side of his mouth.  Denies dysphagia.  The pain is improved with home naproxen.  Admits to intermittent night sweats for the past 3 days.  Has not seen any pus or felt any pus come out of the affected region.  Due to severe pain he presented to the ED for further evaluation and management.  Work-up in the ED revealed acute left submandibular sublingual sialoadenitis seen on CT soft tissue neck with contrast.  EDP discussed with on call ENT, Dr. Suszanne Connerseoh, who will see in consultation.  Patient was started on Unasyn empirically.  Per EDP ENT recommended to hold off Decadron due to uncontrolled diabetes with hyperglycemia.  Added IV vancomycin due to history of MRSA infection.  MRSA screen ordered.  TR[facility], hospitalist team, has been asked to admit.  ED Course:  Temperature 99.0.  BP 136/99, pulse 97, respiration rate 15, O2 saturation 96% on room air.  Lab studies remarkable for serum sodium 135, potassium 3.7, serum bicarb 22, glucose 313, BUN 22, creatinine 1.19 with baseline of 0.85, anion gap of 16, GFR greater than 60.  WBC 11.4, hemoglobin 17.5, platelet count 214.  Review of Systems: Review of systems as noted in the HPI. All other  systems reviewed and are negative.   Past Medical History:  Diagnosis Date   Diabetes mellitus without complication (HCC)    Hypertension    History reviewed. No pertinent surgical history.  Social History:  reports that he has been smoking. He has never used smokeless tobacco. He reports current alcohol use. He reports that he does not use drugs.   No Known Allergies  Family history: Father with history of GI cancer.  Prior to Admission medications   Medication Sig Start Date End Date Taking? Authorizing Provider  acetaminophen (TYLENOL) 325 MG tablet Take 2 tablets (650 mg total) by mouth every 6 (six) hours as needed. 01/25/19  Yes Derwood KaplanNanavati, Ankit, MD  aspirin 81 MG tablet Take 81 mg by mouth daily.   Yes [provider]  benazepril (LOTENSIN) 40 MG tablet Take 40 mg by mouth every morning. 04/02/20  Yes [provider]  chlorthalidone (HYGROTON) 25 MG tablet Take 25 mg by mouth daily. 05/05/18  Yes [provider]  FARXIGA 10 MG TABS tablet Take 10 mg by mouth every morning. 07/04/20  Yes [provider]  insulin glargine (LANTUS) 100 UNIT/ML Solostar Pen Inject 25 Units into the skin daily. 03/15/16  Yes [provider]  metFORMIN (GLUCOPHAGE) 1000 MG tablet Take 1 tablet by mouth 2 (two) times daily. 07/04/20  Yes [provider]  naproxen (NAPROSYN) 500 MG tablet Take 500 mg by mouth 2 (two) times daily as needed for mild pain or headache. 03/23/16  Yes [provider]  pregabalin (LYRICA) 150 MG capsule Take 150  mg by mouth 2 (two) times daily. 02/23/17  Yes [provider]  Semaglutide, 1 MG/DOSE, (OZEMPIC, 1 MG/DOSE,) 2 MG/1.5ML SOPN Inject 1.5 mg into the skin once a week.   Yes [provider]  traMADol (ULTRAM) 50 MG tablet Take 50 mg by mouth 3 (three) times daily as needed for moderate pain. 07/09/18  Yes [provider]  Oxycodone HCl 10 MG TABS Take one tablet by mouth every 4 hours as  needed for pain Patient not taking: No sig reported 04/05/14   Reed, Tiffany L, DO  OxyCODONE HCl ER (OXYCONTIN) 30 MG T12A Take one tablet by mouth every 12 hours for pain. Do not crush Patient not taking: No sig reported 04/05/14   Kermit Balo, DO    Physical Exam: BP (!) 170/124 (BP Location: Right Arm)   Pulse (!) 104   Temp 99 F (37.2 C) (Oral)   Resp 16   SpO2 98%   General: 45 y.o. year-old male well developed well nourished in no acute distress.  Alert and oriented x3.  Under the left side of his chin and left side of his neck edema and tenderness to minimal touch.  No trismus, tolerating oral secretions without difficulty.  Phonation is normal. Cardiovascular: Regular rate and rhythm with no rubs or gallops.  No thyromegaly or JVD noted.  No lower extremity edema. 2/4 pulses in all 4 extremities. Respiratory: Clear to auscultation with no wheezes or rales. Good inspiratory effort. Abdomen: Soft nontender nondistended with normal bowel sounds x4 quadrants. Muskuloskeletal: No cyanosis, clubbing or edema noted bilaterally Neuro: CN II-XII intact, strength, sensation, reflexes Skin: No ulcerative lesions noted or rashes.  Scars from previous surgeries in left lower extremity. Psychiatry: Judgement and insight appear normal. Mood is appropriate for condition and setting          Labs on Admission:  Basic Metabolic Panel: Recent Labs  Lab 09/19/20 0112  NA 135  K 3.7  CL 97*  CO2 22  GLUCOSE 313*  BUN 22*  CREATININE 1.19  CALCIUM 9.5   Liver Function Tests: No results for input(s): AST, ALT, ALKPHOS, BILITOT, PROT, ALBUMIN in the last 168 hours. No results for input(s): LIPASE, AMYLASE in the last 168 hours. No results for input(s): AMMONIA in the last 168 hours. CBC: Recent Labs  Lab 09/19/20 0112  WBC 11.4*  NEUTROABS 7.8*  HGB 17.5*  HCT 51.1  MCV 90.1  PLT 214   Cardiac Enzymes: No results for input(s): CKTOTAL, CKMB, CKMBINDEX, TROPONINI in the last  168 hours.  BNP (last 3 results) No results for input(s): BNP in the last 8760 hours.  ProBNP (last 3 results) No results for input(s): PROBNP in the last 8760 hours.  CBG: No results for input(s): GLUCAP in the last 168 hours.  Radiological Exams on Admission: CT Soft Tissue Neck W Contrast  Result Date: 09/19/2020 CLINICAL DATA:  Initial evaluation for acute left-sided neck pain, swelling. EXAM: CT NECK WITH CONTRAST TECHNIQUE: Multidetector CT imaging of the neck was performed using the standard protocol following the bolus administration of intravenous contrast. CONTRAST:  69mL OMNIPAQUE IOHEXOL 300 MG/ML  SOLN COMPARISON:  None. FINDINGS: Pharynx and larynx: Palatine tonsils symmetric and within normal limits. No discrete tonsillar or peritonsillar abscess. Remainder of the oropharynx and nasopharynx within normal limits. No retropharyngeal collection or swelling. Epiglottis normal. Remainder of the hypopharynx and supraglottic larynx within normal limits. There is a possible 3 mm polypoid projection extending from the right true vocal  cord near the anterior commissure (series 3, image 90 on axial sequence, series 7, image 73 on coronal sequence). Glottis otherwise unremarkable. Subglottic airway patent and clear. Salivary glands: Parotid glands within normal limits. Right submandibular gland normal. On the left, there is slight asymmetric enlargement with heterogeneity of the left submandibular gland, suspicious for acute sialoadenitis. The left submandibular duct is mildly dilated. Serpiginous hypodensities at the level of the left floor of mouth and sublingual space favored to reflect tortuosity of the dilated left submandibular duct, although associated abscess formation is difficult to exclude. Area of involvement measures approximately 2.4 cm in greatest dimension (series 7, image 49). The left sublingual gland appears to be enlarged and inflamed as well. The oral tongue appears somewhat  lifted in the oral cavity. Associated swelling with inflammatory stranding noted within the adjacent left greater than right submandibular spaces and submental region, consistent with regional cellulitis. Thyroid: Normal. Lymph nodes: Mildly prominent level I a and IB nodes noted, largest of which seen at left level IB and measures 8 mm. Findings presumably reactive. No other enlarged or pathologic adenopathy within the neck. Vascular: Normal intravascular enhancement seen throughout the neck. Limited intracranial: Unremarkable. Visualized orbits: Unremarkable. Mastoids and visualized paranasal sinuses: Paranasal sinuses are clear. Mastoid air cells and middle ear cavities are well pneumatized and free of fluid. Skeleton: No acute osseous abnormality. No discrete or worrisome osseous lesions. Torus mandibularis noted. Upper chest: Visualized upper chest demonstrates no acute finding. Partially visualized lungs are largely clear. Other: None. IMPRESSION: 1. Findings consistent with acute left submandibular/sublingual sialoadenitis with associated regional cellulitis within the adjacent left greater than right submandibular spaces and submental region. Mild dilatation of the left submandibular duct without obstructive stone. Serpiginous hypodensities at the left sublingual space favored to reflect ductal tortuosity, although associated abscess formation is difficult to exclude. Additionally, there is mild mass effect about the left sublingual space with lifting of the oral tongue, suggesting that this patient could potentially be at risk for airway compromise. Close clinical monitoring recommended. 2. Mildly prominent left-sided cervical adenopathy as above, presumably reactive. 3. Question 3 mm polypoid projection extending from the right true vocal cord near the anterior commissure, which could reflect a small polyp or mass. ENT referral for direct visualization suggested. Electronically Signed   By: Rise Mu M.D.   On: 09/19/2020 03:37    EKG: I independently viewed the EKG done and my findings are as followed: None available at the time of this visit.  Assessment/Plan Present on Admission:  Acute sialoadenitis  Active Problems:   Acute sialoadenitis  Acute left submandibular, sublingual sialoadenitis with associated regional cellulitis seen on CT soft tissue, POA Presented with acute left-sided cheek and left-sided neck pain with swelling of 3 days duration. CT soft tissue neck with contrast done on 09/19/2020 revealed findings consistent with acute left submandibular sublingual sialoadenitis with associated regional cellulitis within the adjacent left greater than right submandibular spaces and submental region.  Mild dilatation of the left submandibular duct without obstructive stone.  Associated abscess formation is difficult to exclude.  Additionally there is mild mass-effect about the left sublingual space with lifting of the oral tongue, suggesting that this patient could potentially be at risk for airway compromise.  Close monitoring is recommended. ENT Dr. Suszanne Conners consulted by EDP.  Blood cultures x2 ordered N.p.o. until seen by ENT. Need to send wound fluid sample for culture and analysis if obtained by ENT. He received Unasyn 3 g x 1 dose in  the ED Switched to Zosyn since abscess could not be excluded Added IV vancomycin due to history of MRSA infection. Follow cultures, monitor WBC and fever curve.  Type 2 diabetes with hyperglycemia Reports noncompliance with his insulin for the past couple of days due to his increased left neck and left cheek pain. Obtain hemoglobin A1c Start insulin sliding scale Per EDP, ENT had recommended to hold off Decadron for now due to hyperglycemia.  AKI likely prerenal in the setting of poor oral intake due to pain Baseline creatinine appears to be 0.8 with GFR greater than 60 Presented with creatinine of 1.19 Continue IV fluid hydration LR  at 100 cc/h Monitor urine output Repeat BMP in the morning.  Essential hypertension BP is not at goal, elevated. Holding off home benazepril due to AKI. Switch to Norvasc 5 mg daily. IV antihypertensives as needed with parameters. Continue to closely monitor vital signs.  History of MRSA infection right lower extremity osteomyelitis Follows with ID outpatient States no recent visit. MRSA screening test ordered.   DVT prophylaxis: Subcu Lovenox daily.  Code Status: Full code.  Family Communication: None at bedside.  Disposition Plan: Admit to stepdown unit.  Consults called: ENT consulted by EDP.  Admission status: Inpatient status.  Patient will require at least 2 midnights for further evaluation and treatment of present condition.   Status is: Inpatient    Dispo:  Patient From: Home  Planned Disposition: Home when ENT signs off.  Medically stable for discharge: No         Darlin Drop MD [facility] Pager [phone]  If 7PM-7AM, please contact night-coverage [email or web address] Password [facility]  09/19/2020, 4:53 AM

## 2020-09-19 NOTE — Progress Notes (Addendum)
Inpatient Diabetes Program Recommendations  AACE/ADA: New Consensus Statement on Inpatient Glycemic Control (2015)  Target Ranges:  Prepandial:   less than 140 mg/dL      Peak postprandial:   less than 180 mg/dL (1-2 hours)      Critically ill patients:  140 - 180 mg/dL   Lab Results  Component Value Date   GLUCAP 199 (H) 09/19/2020   HGBA1C 11.8 (H) 09/19/2020    Review of Glycemic Control Results for [name], [name] (MRN [id number]) as of 09/19/2020 11:00  Ref. Range 09/19/2020 06:36 09/19/2020 08:50 09/19/2020 11:02  Glucose-Capillary Latest Ref Range: 70 - 99 mg/dL 563 (H) 875 (H) 643 (H)   Diabetes history: Type 2 DM Outpatient Diabetes medications: Farxiga 10 mg QAM, Ozempic 1.5 mg qwk, Lantus 25 units QD Current orders for Inpatient glycemic control: Novolog 0-15 units Q4H  Inpatient Diabetes Program Recommendations:    Consider adding Lantus 20 units QD and changing correction to TID & HS. Planning to speak with patient as previous A1C in 12/2019 was 6.7%.   Addendum: Spoke with patient regarding outpatient diabetes management. Patient verifies home medications and reports that his PCP recently added [place] because his A1C was increasing. Admits to missing doses multiple times a week. Reviewed patient's current A1c of 11.8%. Explained what a A1c is and what it measures. Also reviewed goal A1c with patient, importance of good glucose control @ home, and blood sugar goals. Reviewed patho of DM, need for insulin, role of pancreas, impact of infection on glycemic control, vascular changes and commorbidities.  Patient has a meter and checks usually once per day. Reviewed when to call MD and has a schedule PCP appointment.  Admits to eating large amounts of CHO. Encouraged to be mindful of CHO in excess. Reviewed plate method and importance of taking medications as prescribed.  No further questions at this time.   Thanks, Lujean Rave, MSN, RNC-OB Diabetes  Coordinator [phone] (8a-5p)

## 2020-09-19 NOTE — Consult Note (Signed)
Reason for Consult: Acute left submandibular sialadenitis Referring Physician: Cherly Anderson, PA-C  HPI:  [name] is an 45 y.o. male who presented to the ER yesterday complaining of left sided neck and oral pain for 2 days. He has a history of MRSA infection, left lower extremity osteomyelitis in 2015, uncontrolled type 2 diabetes, and essential hypertension. The patient states he is having pain and swelling to the left side below his tongue that is constant, progressively worsening, and without alleviating factors.  At times it hurts to swallow.  He denies fever, chills, vomiting, dyspnea, or recent injury. His CT scan showed left acute submandibular and sublingual sialadenitis. 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 The patient reports that his voice has been raspy his whole life. He smokes a few cigarettes a day. No recent weight loss.  Past Medical History:  Diagnosis Date   Diabetes mellitus without complication (HCC)    Hypertension     History reviewed. No pertinent surgical history.  History reviewed. No pertinent family history.  Social History:  reports that he has been smoking. He has never used smokeless tobacco. He reports current alcohol use. He reports that he does not use drugs.  Allergies: No Known Allergies  Prior to Admission medications   Medication Sig Start Date End Date Taking? Authorizing Provider  acetaminophen (TYLENOL) 325 MG tablet Take 2 tablets (650 mg total) by mouth every 6 (six) hours as needed. 01/25/19  Yes Derwood Kaplan, MD  aspirin 81 MG tablet Take 81 mg by mouth daily.   Yes [provider]  benazepril (LOTENSIN) 40 MG tablet Take 40 mg by mouth every morning. 04/02/20  Yes [provider]  chlorthalidone (HYGROTON) 25 MG tablet Take 25 mg by mouth daily. 05/05/18  Yes [provider]  FARXIGA 10 MG TABS tablet Take 10 mg by mouth every morning. 07/04/20  Yes [provider]   insulin glargine (LANTUS) 100 UNIT/ML Solostar Pen Inject 25 Units into the skin daily. 03/15/16  Yes [provider]  metFORMIN (GLUCOPHAGE) 1000 MG tablet Take 1 tablet by mouth 2 (two) times daily. 07/04/20  Yes [provider]  naproxen (NAPROSYN) 500 MG tablet Take 500 mg by mouth 2 (two) times daily as needed for mild pain or headache. 03/23/16  Yes [provider]  pregabalin (LYRICA) 150 MG capsule Take 150 mg by mouth 2 (two) times daily. 02/23/17  Yes [provider]  Semaglutide, 1 MG/DOSE, (OZEMPIC, 1 MG/DOSE,) 2 MG/1.5ML SOPN Inject 1.5 mg into the skin once a week.   Yes [provider]  traMADol (ULTRAM) 50 MG tablet Take 50 mg by mouth 3 (three) times daily as needed for moderate pain. 07/09/18  Yes [provider]  Oxycodone HCl 10 MG TABS Take one tablet by mouth every 4 hours as needed for pain Patient not taking: No sig reported 04/05/14   Reed, Tiffany L, DO  OxyCODONE HCl ER (OXYCONTIN) 30 MG T12A Take one tablet by mouth every 12 hours for pain. Do not crush Patient not taking: No sig reported 04/05/14   Bufford Spikes L, DO    Medications: I have reviewed the patient's current medications. Scheduled:  amLODipine  5 mg Oral Daily   atorvastatin  40 mg Oral Daily   enoxaparin (LOVENOX) injection  40 mg Subcutaneous Q24H   insulin aspart  0-15 Units Subcutaneous Q4H   pantoprazole  40 mg Oral Daily   senna-docusate  2 tablet Oral  QHS   sertraline  50 mg Oral Daily   Continuous:  lactated ringers 100 mL/hr at 09/19/20 0633   piperacillin-tazobactam (ZOSYN)  IV     vancomycin      Results for orders placed or performed during the hospital encounter of 09/18/20 (from the past 48 hour(s))  CBC with Differential     Status: Abnormal   Collection Time: 09/19/20  1:12 AM  Result Value Ref Range   WBC 11.4 (H) 4.0 - 10.5 K/uL   RBC 5.67 4.22 - 5.81 MIL/uL   Hemoglobin 17.5 (H) 13.0 - 17.0 g/dL   HCT 16.1 09.6 - 04.5 %    MCV 90.1 80.0 - 100.0 fL   MCH 30.9 26.0 - 34.0 pg   MCHC 34.2 30.0 - 36.0 g/dL   RDW 40.9 81.1 - 91.4 %   Platelets 214 150 - 400 K/uL   nRBC 0.0 0.0 - 0.2 %   Neutrophils Relative % 70 %   Neutro Abs 7.8 (H) 1.7 - 7.7 K/uL   Lymphocytes Relative 21 %   Lymphs Abs 2.4 0.7 - 4.0 K/uL   Monocytes Relative 8 %   Monocytes Absolute 1.0 0.1 - 1.0 K/uL   Eosinophils Relative 1 %   Eosinophils Absolute 0.1 0.0 - 0.5 K/uL   Basophils Relative 0 %   Basophils Absolute 0.1 0.0 - 0.1 K/uL   Immature Granulocytes 0 %   Abs Immature Granulocytes 0.03 0.00 - 0.07 K/uL    Comment: Performed at [facility], [address] [place], [place] [address]  Basic metabolic panel     Status: Abnormal   Collection Time: 09/19/20  1:12 AM  Result Value Ref Range   Sodium 135 135 - 145 mmol/L   Potassium 3.7 3.5 - 5.1 mmol/L   Chloride 97 (L) 98 - 111 mmol/L   CO2 22 22 - 32 mmol/L   Glucose, Bld 313 (H) 70 - 99 mg/dL    Comment: Glucose reference range applies only to samples taken after fasting for at least 8 hours.   BUN 22 (H) 6 - 20 mg/dL   Creatinine, Ser 6.21 0.61 - 1.24 mg/dL   Calcium 9.5 8.9 - 30.8 mg/dL   GFR, Estimated >65 >78 mL/min    Comment: (NOTE) Calculated using the CKD-EPI Creatinine Equation (2021)    Anion gap 16 (H) 5 - 15    Comment: Performed at [facility], [address] [place], [place] [address]  Magnesium     Status: None   Collection Time: 09/19/20  1:12 AM  Result Value Ref Range   Magnesium 1.9 1.7 - 2.4 mg/dL    Comment: SLIGHT HEMOLYSIS Performed at [facility], [address], [place], [place] [address]   Phosphorus     Status: None   Collection Time: 09/19/20  1:12 AM  Result Value Ref Range   Phosphorus 4.4 2.5 - 4.6 mg/dL    Comment: Performed at [facility], [address] [place], [place] [address]  Resp Panel by RT-PCR (Flu A&B, Covid) Nasopharyngeal Swab     Status: None   Collection Time: 09/19/20  4:12 AM   Specimen:  Nasopharyngeal Swab; Nasopharyngeal(NP) swabs in vial transport medium  Result Value Ref Range   SARS Coronavirus 2 by RT PCR NEGATIVE NEGATIVE    Comment: (NOTE) SARS-CoV-2 target nucleic acids are NOT DETECTED.  The SARS-CoV-2 RNA is generally detectable in upper respiratory specimens during the acute phase of  infection. The lowest concentration of SARS-CoV-2 viral copies this assay can detect is 138 copies/mL. A negative result does not preclude SARS-Cov-2 infection and should not be used as the sole basis for treatment or other patient management decisions. A negative result may occur with  improper specimen collection/handling, submission of specimen other than nasopharyngeal swab, presence of viral mutation(s) within the areas targeted by this assay, and inadequate number of viral copies(<138 copies/mL). A negative result must be combined with clinical observations, patient history, and epidemiological information. The expected result is Negative.  Fact Sheet for Patients:  BloggerCourse.comhttps://www.fda.gov/media/152166/download  Fact Sheet for Healthcare Providers:  SeriousBroker.ithttps://www.fda.gov/media/152162/download  This test is no t yet approved or cleared by the [place] FDA and  has been authorized for detection and/or diagnosis of SARS-CoV-2 by FDA under an Emergency Use Authorization (EUA). This EUA will remain  in effect (meaning this test can be used) for the duration of the COVID-19 declaration under Section 564(b)(1) of the Act, 21 U.S.C.section 360bbb-3(b)(1), unless the authorization is terminated  or revoked sooner.       Influenza A by PCR NEGATIVE NEGATIVE   Influenza B by PCR NEGATIVE NEGATIVE    Comment: (NOTE) The Xpert Xpress SARS-CoV-2/FLU/RSV plus assay is intended as an aid in the diagnosis of influenza from Nasopharyngeal swab specimens and should not be used as a sole basis for treatment. Nasal washings and aspirates are unacceptable for Xpert Xpress  SARS-CoV-2/FLU/RSV testing.  Fact Sheet for Patients: BloggerCourse.comhttps://www.fda.gov/media/152166/download  Fact Sheet for Healthcare Providers: SeriousBroker.ithttps://www.fda.gov/media/152162/download  This test is not yet approved or cleared by the [place] FDA and has been authorized for detection and/or diagnosis of SARS-CoV-2 by FDA under an Emergency Use Authorization (EUA). This EUA will remain in effect (meaning this test can be used) for the duration of the COVID-19 declaration under Section 564(b)(1) of the Act, 21 U.S.C. section 360bbb-3(b)(1), unless the authorization is terminated or revoked.  Performed at [facility], [address] [place], [place] [address]   CBG monitoring, ED     Status: Abnormal   Collection Time: 09/19/20  4:58 AM  Result Value Ref Range   Glucose-Capillary 208 (H) 70 - 99 mg/dL    Comment: Glucose reference range applies only to samples taken after fasting for at least 8 hours.  Hemoglobin A1c     Status: Abnormal   Collection Time: 09/19/20  5:22 AM  Result Value Ref Range   Hgb A1c MFr Bld 11.8 (H) 4.8 - 5.6 %    Comment: (NOTE) Pre diabetes:          5.7%-6.4%  Diabetes:              >6.4%  Glycemic control for   <7.0% adults with diabetes    Mean Plasma Glucose 291.96 mg/dL    Comment: Performed at [facility], [address] [place], [place] [address]  MRSA Next Gen by PCR, Nasal     Status: None   Collection Time: 09/19/20  6:21 AM   Specimen: Nasal Mucosa; Nasal Swab  Result Value Ref Range   MRSA by PCR Next Gen NOT DETECTED NOT DETECTED    Comment: (NOTE) The GeneXpert MRSA Assay (FDA approved for NASAL specimens only), is one component of a comprehensive MRSA colonization surveillance program. It is not intended to diagnose MRSA infection nor to guide or monitor treatment for MRSA infections. Test performance is not FDA approved in patients less than 43 years old. Performed at [facility], [address]  [place], [place] [address]   Glucose, capillary     Status: Abnormal   Collection Time: 09/19/20  6:36 AM  Result Value Ref Range   Glucose-Capillary 179 (H) 70 - 99 mg/dL    Comment: Glucose reference range applies only to samples taken after fasting for at least 8 hours.  Glucose, capillary     Status: Abnormal   Collection Time: 09/19/20  8:50 AM  Result Value Ref Range   Glucose-Capillary 178 (H) 70 - 99 mg/dL    Comment: Glucose reference range applies only to samples taken after fasting for at least 8 hours.    CT Soft Tissue Neck W Contrast  Result Date: 09/19/2020 CLINICAL DATA:  Initial evaluation for acute left-sided neck pain, swelling. EXAM: CT NECK WITH CONTRAST TECHNIQUE: Multidetector CT imaging of the neck was performed using the standard protocol following the bolus administration of intravenous contrast. CONTRAST:  74mL OMNIPAQUE IOHEXOL 300 MG/ML  SOLN COMPARISON:  None. FINDINGS: Pharynx and larynx: Palatine tonsils symmetric and within normal limits. No discrete tonsillar or peritonsillar abscess. Remainder of the oropharynx and nasopharynx within normal limits. No retropharyngeal collection or swelling. Epiglottis normal. Remainder of the hypopharynx and supraglottic larynx within normal limits. There is a possible 3 mm polypoid projection extending from the right true vocal cord near the anterior commissure (series 3, image 90 on axial sequence, series 7, image 73 on coronal sequence). Glottis otherwise unremarkable. Subglottic airway patent and clear. Salivary glands: Parotid glands within normal limits. Right submandibular gland normal. On the left, there is slight asymmetric enlargement with heterogeneity of the left submandibular gland, suspicious for acute sialoadenitis. The left submandibular duct is mildly dilated. Serpiginous hypodensities at the level of the left floor of mouth and sublingual space favored to reflect tortuosity of the dilated left submandibular duct,  although associated abscess formation is difficult to exclude. Area of involvement measures approximately 2.4 cm in greatest dimension (series 7, image 49). The left sublingual gland appears to be enlarged and inflamed as well. The oral tongue appears somewhat lifted in the oral cavity. Associated swelling with inflammatory stranding noted within the adjacent left greater than right submandibular spaces and submental region, consistent with regional cellulitis. Thyroid: Normal. Lymph nodes: Mildly prominent level I a and IB nodes noted, largest of which seen at left level IB and measures 8 mm. Findings presumably reactive. No other enlarged or pathologic adenopathy within the neck. Vascular: Normal intravascular enhancement seen throughout the neck. Limited intracranial: Unremarkable. Visualized orbits: Unremarkable. Mastoids and visualized paranasal sinuses: Paranasal sinuses are clear. Mastoid air cells and middle ear cavities are well pneumatized and free of fluid. Skeleton: No acute osseous abnormality. No discrete or worrisome osseous lesions. Torus mandibularis noted. Upper chest: Visualized upper chest demonstrates no acute finding. Partially visualized lungs are largely clear. Other: None. IMPRESSION: 1. Findings consistent with acute left submandibular/sublingual sialoadenitis with associated regional cellulitis within the adjacent left greater than right submandibular spaces and submental region. Mild dilatation of the left submandibular duct without obstructive stone. Serpiginous hypodensities at the left sublingual space favored to reflect ductal tortuosity, although associated abscess formation is difficult to exclude. Additionally, there is mild mass effect about the left sublingual space with lifting of the oral tongue, suggesting that this patient could potentially be at risk for airway compromise. Close clinical monitoring recommended. 2. Mildly prominent left-sided cervical adenopathy as above,  presumably reactive. 3. Question 3 mm polypoid projection extending from the right true vocal  cord near the anterior commissure, which could reflect a small polyp or mass. ENT referral for direct visualization suggested. Electronically Signed   By: Rise Mu M.D.   On: 09/19/2020 03:37    Review of Systems Constitutional:  Negative for chills and fever. HENT:  Positive for dental problem.   Respiratory:  Negative for shortness of breath.   Cardiovascular:  Negative for chest pain. Gastrointestinal:  Negative for vomiting. Musculoskeletal:  Positive for neck pain. Negative for neck stiffness. Neurological:  Negative for syncope.  All other systems reviewed and are negative.  Blood pressure (!) 149/110, pulse 90, temperature 98.3 F (36.8 C), temperature source Oral, resp. rate 15, height 5\' 10"  (1.778 m), weight 103.6 kg, SpO2 95 %. General appearance: alert and cooperative. NAD. Eyes: Pupils are equal, round, reactive to light. Extraocular motion is intact.  Ears: Examination of the ears shows normal auricles and external auditory canals bilaterally.  Nose: Nasal examination shows normal mucosa, septum, turbinates.  Face: Facial examination shows no asymmetry. Palpation of the face elicit no significant tenderness.  Mouth: Oral cavity examination shows no mucosal abnormalty. No significant trismus is noted.  The left floor of mouth is tender to touch. Neck: Palpation of the neck reveals left submandibular swelling. The trachea is midline. The thyroid is not significantly enlarged.  Neuro: Cranial nerves 2-12 are all grossly in tact.   Procedure:  Flexible Fiberoptic Laryngoscopy Anesthesia: None Indication: Laryngeal mass on CT scan. Description: Risks, benefits, and alternatives of flexible endoscopy were explained to the patient. Specific mention was made of the risk of throat numbness with difficulty swallowing, possible bleeding from the nose and mouth, and pain from the  procedure.  The patient gave oral consent to proceed. The flexible scope was inserted into the right nasal cavity and advanced towards the nasopharynx.  Visualized mucosa over the turbinates and septum were normal.  The nasopharynx was clear.  Oropharyngeal walls were symmetric and mobile without lesion, mass, or edema.  Hypopharynx was also without  lesion or edema.  Larynx was mobile without lesions.  No lesions or asymmetry in the supraglottic larynx.  Arytenoid mucosa was normal. True vocal folds were mobile. A 74mm pedunculated polyp was noted at the anterior commisure.  Assessment/Plan: - Acute left submandibular and sublingual sialadenitis.   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   - Likely will need surgical removal. Can be arranged as an outpatient.  Nashley Cordoba W Kenon Delashmit 09/19/2020, 10:07 AM

## 2020-09-19 NOTE — Plan of Care (Signed)
Nutrition Education Note  RD consulted for nutrition education regarding diabetes. Pt spoke with Diabetes Coordinator earlier today.  Spoke with pt at bedside. Pt very lethargic at time of visit and only opening eyes briefly during RD visit. Pt remembers speaking with Diabetes Coordinator. He states that they discussed pt needing to change his diet and improve his eating because his hemoglobin A1C has increased. Pt falling asleep during RD visit so unable to provide a detailed diet history. Pt reports that he does drink water, diet soda, and regular soda.  Lab Results  Component Value Date   HGBA1C 11.8 (H) 09/19/2020    RD provided "Carbohydrate Counting for People with Diabetes" handout from the Academy of Nutrition and Dietetics. Discussed different food groups and their effects on blood sugar, emphasizing carbohydrate-containing foods. Provided list of carbohydrates and recommended serving sizes of common foods.  Discussed importance of controlled and consistent carbohydrate intake throughout the day. Provided examples of ways to balance meals/snacks and encouraged intake of high-fiber, whole grain complex carbohydrates. Teach back method used.  Expect fair compliance.  Current diet order is Carb Modified, patient is consuming approximately 10-75% of meals at this time. Labs and medications reviewed. No further nutrition interventions warranted at this time. RD contact information provided. If additional nutrition issues arise, please re-consult RD.   Tyrone Clause, MS, RD, LDN Inpatient Clinical Dietitian Please see AMiON for contact information.

## 2020-09-19 NOTE — ED Notes (Signed)
Report attempted x 1

## 2020-09-19 NOTE — Progress Notes (Signed)
Pharmacy Antibiotic Note  [name] is a 45 y.o. male admitted on 09/18/2020 with  acute left submandibular/sublingual sialoadenitis with associated regional cellulitis .  Pharmacy has been consulted for vancomycin dosing given h/o MRSA infection.  To add zosyn, received unasyn x 1 in ED  Plan: Vancomycin 2000mg  x1 then 1000mg  IV Q12H. Goal AUC 400-550.  Expected AUC 500.  SCr 1.19.  Zosyn 3.375g IV q 8 hours (extended infusion) Monitor renal function, clinical progression and ability to narrow  Height: 5\' 10"  (177.8 cm) Weight: 103.6 kg (228 lb 6.3 oz) IBW/kg (Calculated) : 73  Temp (24hrs), Avg:98.7 F (37.1 C), Min:98.3 F (36.8 C), Max:99 F (37.2 C)  Recent Labs  Lab 09/19/20 0112  WBC 11.4*  CREATININE 1.19     Estimated Creatinine Clearance: 97.5 mL/min (by C-G formula based on SCr of 1.19 mg/dL).    No Known Allergies   , PharmD Clinical Pharmacist ED Pharmacist Phone # [phone] 09/19/2020 6:24 AM

## 2020-09-19 NOTE — Progress Notes (Signed)
Pharmacy Antibiotic Note  [name] is a 45 y.o. male admitted on 09/18/2020 with  acute left submandibular/sublingual sialoadenitis with associated regional cellulitis .  Pharmacy has been consulted for vancomycin dosing given h/o MRSA infection.  Plan: Vancomycin 2000mg  x1 then 1000mg  IV Q12H. Goal AUC 400-550.  Expected AUC 500.  SCr 1.19.   Height: 5\' 10"  (177.8 cm) Weight: 108.9 kg (240 lb) IBW/kg (Calculated) : 73  Temp (24hrs), Avg:99 F (37.2 C), Min:99 F (37.2 C), Max:99 F (37.2 C)  Recent Labs  Lab 09/19/20 0112  WBC 11.4*  CREATININE 1.19    Estimated Creatinine Clearance: 100 mL/min (by C-G formula based on SCr of 1.19 mg/dL).    No Known Allergies   Thank you for allowing pharmacy to be a part of this patient's care.  , PharmD, BCPS  09/19/2020 4:58 AM

## 2020-09-20 LAB — BASIC METABOLIC PANEL
Anion gap: 14 (ref 5–15)
BUN: 13 mg/dL (ref 6–20)
CO2: 22 mmol/L (ref 22–32)
Calcium: 9 mg/dL (ref 8.9–10.3)
Chloride: 100 mmol/L (ref 98–111)
Creatinine, Ser: 1.15 mg/dL (ref 0.61–1.24)
GFR, Estimated: >60 mL/min (ref >60)
Glucose, Bld: 226 mg/dL — ABNORMAL HIGH (ref 70–99)
Potassium: 3.5 mmol/L (ref 3.5–5.1)
Sodium: 136 mmol/L (ref 135–145)

## 2020-09-20 LAB — GLUCOSE, CAPILLARY
Glucose-Capillary: 283 mg/dL — ABNORMAL HIGH (ref 70–99)
Glucose-Capillary: 341 mg/dL — ABNORMAL HIGH (ref 70–99)
Glucose-Capillary: 335 mg/dL — ABNORMAL HIGH (ref 70–99)
Glucose-Capillary: 386 mg/dL — ABNORMAL HIGH (ref 70–99)

## 2020-09-20 LAB — CBC WITH DIFFERENTIAL/PLATELET
Abs Immature Granulocytes: 0.06 10*3/uL (ref 0.00–0.07)
Basophils Absolute: 0 10*3/uL (ref 0.0–0.1)
Basophils Relative: 0 %
Eosinophils Absolute: 0 10*3/uL (ref 0.0–0.5)
Eosinophils Relative: 0 %
HCT: 45.3 % (ref 39.0–52.0)
Hemoglobin: 15.4 g/dL (ref 13.0–17.0)
Immature Granulocytes: 1 %
Lymphocytes Relative: 8 %
Lymphs Abs: 1 10*3/uL (ref 0.7–4.0)
MCH: 30.8 pg (ref 26.0–34.0)
MCHC: 34 g/dL (ref 30.0–36.0)
MCV: 90.6 fL (ref 80.0–100.0)
Monocytes Absolute: 0.3 10*3/uL (ref 0.1–1.0)
Monocytes Relative: 3 %
Neutro Abs: 11.1 10*3/uL — ABNORMAL HIGH (ref 1.7–7.7)
Neutrophils Relative %: 88 %
Platelets: 212 10*3/uL (ref 150–400)
RBC: 5 MIL/uL (ref 4.22–5.81)
RDW: 12.6 % (ref 11.5–15.5)
WBC: 12.5 10*3/uL — ABNORMAL HIGH (ref 4.0–10.5)
nRBC: 0 % (ref 0.0–0.2)

## 2020-09-20 MED ORDER — INSULIN ASPART 100 UNIT/ML IJ SOLN
0.0000 [IU] | Freq: Three times a day (TID) | INTRAMUSCULAR | Status: DC
  Administered 2020-09-20 (×2): 15 [IU] via SUBCUTANEOUS
  Administered 2020-09-21: 20 [IU] via SUBCUTANEOUS
  Administered 2020-09-21: 11 [IU] via SUBCUTANEOUS
  Administered 2020-09-21: 20 [IU] via SUBCUTANEOUS
  Administered 2020-09-22: 15 [IU] via SUBCUTANEOUS

## 2020-09-20 MED ORDER — INSULIN GLARGINE 100 UNIT/ML ~~LOC~~ SOLN
30.0000 [IU] | Freq: Every day | SUBCUTANEOUS | Status: DC
  Filled 2020-09-20: qty 0.3

## 2020-09-20 MED ORDER — INSULIN GLARGINE 100 UNIT/ML ~~LOC~~ SOLN
10.0000 [IU] | Freq: Once | SUBCUTANEOUS | Status: AC
  Administered 2020-09-20: 10 [IU] via SUBCUTANEOUS
  Filled 2020-09-20: qty 0.1

## 2020-09-20 NOTE — Progress Notes (Signed)
PROGRESS NOTE    [name]  WNU:[id number] DOB: April 28, 1975 DOA: 09/18/2020 PCP: Sherron Mondayejan-Sie, S Ahmed, MD   Brief Narrative: [name] is a 45 y.o. male with a history of diabetes mellitus, hypertension. Patient presented secondary to worsening jaw pain and found to have evidence of acute sialoadenitis with cellulitis. Started on IV antibiotics. ENT consulted with recommendations for continued antibiotics.   Assessment & Plan:   Principal Problem:   Acute sialoadenitis Active Problems:   Diabetes mellitus type 2, uncontrolled (HCC)   Primary hypertension   Acute left submandibular/sublingual sialoadenitis Cellulitis CT scan significant for sialoadenitis with associated regional cellulitis. No significant cellulitis on clinical exam. Significant pain. ENT consulted and no role for surgery and with recommendations for continued antibiotics. -Continue Zosyn and Vancomycin per ENT recommendations -Follow-up blood cultures  Diabetes mellitus, type 2 Uncontrolled with hyperglycemia. Patient is on Lantus, Marcelline DeistFarxiga, Ozempic as an outpatient. Hemoglobin A1C of 11.8% . Started on SSI while inpatient -Increase to Lantus 30 units daily while on steroids -Switch to resistant scale SSI qAC while on steroids -Continue Novolog 5 units TID with meals  AKI Ruled out. Does not meet criteria for AKI.  Primary hypertension -Continue amlodipine  History of LE osteomyelitis Not on antibiotics. Follows with ID.  Vocal cord polyp 3 mm. Per ENT Dr. Suszanne Connerseoh, will need surgical removal which can be arranged as an outpatient.   DVT prophylaxis: Lovenox Code Status:   Code Status: Full Code Family Communication: None at bedside Disposition Plan: Discharge likely home in 1-2 days pending culture results, transition to oral antibiotics   Consultants:  ENT  Procedures:  None  Antimicrobials: Zosyn Vancomycin    Subjective: Pain and swelling are  improving.  Objective: Vitals:   09/19/20 1900 09/19/20 2300 09/20/20 0300 09/20/20 0710  BP: (!) 138/102 (!) 147/107 126/87 (!) 152/98  Pulse: 84 93 78 77  Resp: 14 17 13 13   Temp: 98.2 F (36.8 C) 98.6 F (37 C) 98.1 F (36.7 C) 98.2 F (36.8 C)  TempSrc: Oral Oral Oral Oral  SpO2: 95% 95% 97% 99%  Weight:      Height:        Intake/Output Summary (Last 24 hours) at 09/20/2020 1034 Last data filed at 09/20/2020 0330 Gross per 24 hour  Intake 548.7 ml  Output --  Net 548.7 ml    Filed Weights   09/19/20 0400 09/19/20 0618  Weight: 108.9 kg 103.6 kg    Examination:  General exam: Appears calm and comfortable HEENT: submandibular area with induration and no fluctuance felt; mild tenderness and no erythema. Respiratory system: Clear to auscultation. Respiratory effort normal. Cardiovascular system: S1 & S2 heard, RRR. No murmurs, rubs, gallops or clicks. Gastrointestinal system: Abdomen is nondistended, soft and nontender. No organomegaly or masses felt. Normal bowel sounds heard. Central nervous system: Alert and oriented. No focal neurological deficits. Musculoskeletal: No edema. No calf tenderness Skin: No cyanosis. No rashes Psychiatry: Judgement and insight appear normal. Mood & affect appropriate.     Data Reviewed: I have personally reviewed following labs and imaging studies  CBC Lab Results  Component Value Date   WBC 12.5 (H) 09/20/2020   RBC 5.00 09/20/2020   HGB 15.4 09/20/2020   HCT 45.3 09/20/2020   MCV 90.6 09/20/2020   MCH 30.8 09/20/2020   PLT 212 09/20/2020   MCHC 34.0 09/20/2020   RDW 12.6 09/20/2020   LYMPHSABS 1.0 09/20/2020   MONOABS 0.3 09/20/2020   EOSABS 0.0 09/20/2020  BASOSABS 0.0 09/20/2020     Last metabolic panel Lab Results  Component Value Date   NA 136 09/20/2020   K 3.5 09/20/2020   CL 100 09/20/2020   CO2 22 09/20/2020   BUN 13 09/20/2020   CREATININE 1.15 09/20/2020   GLUCOSE 226 (H) 09/20/2020   GFRNONAA  >60 09/20/2020   GFRAA >60 07/09/2019   CALCIUM 9.0 09/20/2020   PHOS 4.4 09/19/2020   PROT 7.1 01/25/2019   ALBUMIN 4.1 01/25/2019   BILITOT 0.8 01/25/2019   ALKPHOS 108 01/25/2019   AST 18 01/25/2019   ALT 34 01/25/2019   ANIONGAP 14 09/20/2020    CBG (last 3)  Recent Labs    09/19/20 1704 09/19/20 2113 09/20/20 0615  GLUCAP 226* 198* 283*      GFR: Estimated Creatinine Clearance: 100.8 mL/min (by C-G formula based on SCr of 1.15 mg/dL).  Coagulation Profile: No results for input(s): INR, PROTIME in the last 168 hours.  Recent Results (from the past 240 hour(s))  Resp Panel by RT-PCR (Flu A&B, Covid) Nasopharyngeal Swab     Status: None   Collection Time: 09/19/20  4:12 AM   Specimen: Nasopharyngeal Swab; Nasopharyngeal(NP) swabs in vial transport medium  Result Value Ref Range Status   SARS Coronavirus 2 by RT PCR NEGATIVE NEGATIVE Final    Comment: (NOTE) SARS-CoV-2 target nucleic acids are NOT DETECTED.  The SARS-CoV-2 RNA is generally detectable in upper respiratory specimens during the acute phase of infection. The lowest concentration of SARS-CoV-2 viral copies this assay can detect is 138 copies/mL. A negative result does not preclude SARS-Cov-2 infection and should not be used as the sole basis for treatment or other patient management decisions. A negative result may occur with  improper specimen collection/handling, submission of specimen other than nasopharyngeal swab, presence of viral mutation(s) within the areas targeted by this assay, and inadequate number of viral copies(<138 copies/mL). A negative result must be combined with clinical observations, patient history, and epidemiological information. The expected result is Negative.  Fact Sheet for Patients:  BloggerCourse.com  Fact Sheet for Healthcare Providers:  SeriousBroker.it  This test is no t yet approved or cleared by the [place]  FDA and  has been authorized for detection and/or diagnosis of SARS-CoV-2 by FDA under an Emergency Use Authorization (EUA). This EUA will remain  in effect (meaning this test can be used) for the duration of the COVID-19 declaration under Section 564(b)(1) of the Act, 21 U.S.C.section 360bbb-3(b)(1), unless the authorization is terminated  or revoked sooner.       Influenza A by PCR NEGATIVE NEGATIVE Final   Influenza B by PCR NEGATIVE NEGATIVE Final    Comment: (NOTE) The Xpert Xpress SARS-CoV-2/FLU/RSV plus assay is intended as an aid in the diagnosis of influenza from Nasopharyngeal swab specimens and should not be used as a sole basis for treatment. Nasal washings and aspirates are unacceptable for Xpert Xpress SARS-CoV-2/FLU/RSV testing.  Fact Sheet for Patients: BloggerCourse.com  Fact Sheet for Healthcare Providers: SeriousBroker.it  This test is not yet approved or cleared by the [place] FDA and has been authorized for detection and/or diagnosis of SARS-CoV-2 by FDA under an Emergency Use Authorization (EUA). This EUA will remain in effect (meaning this test can be used) for the duration of the COVID-19 declaration under Section 564(b)(1) of the Act, 21 U.S.C. section 360bbb-3(b)(1), unless the authorization is terminated or revoked.  Performed at [facility], [address], [place], [place] [address]  Culture, blood (routine x 2)     Status: None (Preliminary result)   Collection Time: 09/19/20  5:23 AM   Specimen: BLOOD  Result Value Ref Range Status   Specimen Description BLOOD RIGHT ANTECUBITAL  Final   Special Requests   Final    BOTTLES DRAWN AEROBIC AND ANAEROBIC Blood Culture adequate volume   Culture   Final    NO GROWTH 1 DAY Performed at [facility], [address] [place], [place] [address]    Report Status PENDING  Incomplete  Culture, blood (routine x 2)     Status: None  (Preliminary result)   Collection Time: 09/19/20  5:30 AM   Specimen: BLOOD LEFT HAND  Result Value Ref Range Status   Specimen Description BLOOD LEFT HAND  Final   Special Requests   Final    BOTTLES DRAWN AEROBIC AND ANAEROBIC Blood Culture adequate volume   Culture   Final    NO GROWTH 1 DAY Performed at [facility], [address] [place], [place] [address]    Report Status PENDING  Incomplete  MRSA Next Gen by PCR, Nasal     Status: None   Collection Time: 09/19/20  6:21 AM   Specimen: Nasal Mucosa; Nasal Swab  Result Value Ref Range Status   MRSA by PCR Next Gen NOT DETECTED NOT DETECTED Final    Comment: (NOTE) The GeneXpert MRSA Assay (FDA approved for NASAL specimens only), is one component of a comprehensive MRSA colonization surveillance program. It is not intended to diagnose MRSA infection nor to guide or monitor treatment for MRSA infections. Test performance is not FDA approved in patients less than 88 years old. Performed at [facility], [address] [place], [place] [address]          Radiology Studies: CT Soft Tissue Neck W Contrast  Result Date: 09/19/2020 CLINICAL DATA:  Initial evaluation for acute left-sided neck pain, swelling. EXAM: CT NECK WITH CONTRAST TECHNIQUE: Multidetector CT imaging of the neck was performed using the standard protocol following the bolus administration of intravenous contrast. CONTRAST:  77mL OMNIPAQUE IOHEXOL 300 MG/ML  SOLN COMPARISON:  None. FINDINGS: Pharynx and larynx: Palatine tonsils symmetric and within normal limits. No discrete tonsillar or peritonsillar abscess. Remainder of the oropharynx and nasopharynx within normal limits. No retropharyngeal collection or swelling. Epiglottis normal. Remainder of the hypopharynx and supraglottic larynx within normal limits. There is a possible 3 mm polypoid projection extending from the right true vocal cord near the anterior commissure (series 3, image 90 on axial  sequence, series 7, image 73 on coronal sequence). Glottis otherwise unremarkable. Subglottic airway patent and clear. Salivary glands: Parotid glands within normal limits. Right submandibular gland normal. On the left, there is slight asymmetric enlargement with heterogeneity of the left submandibular gland, suspicious for acute sialoadenitis. The left submandibular duct is mildly dilated. Serpiginous hypodensities at the level of the left floor of mouth and sublingual space favored to reflect tortuosity of the dilated left submandibular duct, although associated abscess formation is difficult to exclude. Area of involvement measures approximately 2.4 cm in greatest dimension (series 7, image 49). The left sublingual gland appears to be enlarged and inflamed as well. The oral tongue appears somewhat lifted in the oral cavity. Associated swelling with inflammatory stranding noted within the adjacent left greater than right submandibular spaces and submental region, consistent with regional cellulitis. Thyroid: Normal. Lymph nodes: Mildly prominent level I a and IB nodes noted, largest of  which seen at left level IB and measures 8 mm. Findings presumably reactive. No other enlarged or pathologic adenopathy within the neck. Vascular: Normal intravascular enhancement seen throughout the neck. Limited intracranial: Unremarkable. Visualized orbits: Unremarkable. Mastoids and visualized paranasal sinuses: Paranasal sinuses are clear. Mastoid air cells and middle ear cavities are well pneumatized and free of fluid. Skeleton: No acute osseous abnormality. No discrete or worrisome osseous lesions. Torus mandibularis noted. Upper chest: Visualized upper chest demonstrates no acute finding. Partially visualized lungs are largely clear. Other: None. IMPRESSION: 1. Findings consistent with acute left submandibular/sublingual sialoadenitis with associated regional cellulitis within the adjacent left greater than right  submandibular spaces and submental region. Mild dilatation of the left submandibular duct without obstructive stone. Serpiginous hypodensities at the left sublingual space favored to reflect ductal tortuosity, although associated abscess formation is difficult to exclude. Additionally, there is mild mass effect about the left sublingual space with lifting of the oral tongue, suggesting that this patient could potentially be at risk for airway compromise. Close clinical monitoring recommended. 2. Mildly prominent left-sided cervical adenopathy as above, presumably reactive. 3. Question 3 mm polypoid projection extending from the right true vocal cord near the anterior commissure, which could reflect a small polyp or mass. ENT referral for direct visualization suggested. Electronically Signed   By: Rise Mu M.D.   On: 09/19/2020 03:37        Scheduled Meds:  amLODipine  5 mg Oral Daily   atorvastatin  40 mg Oral Daily   dexamethasone (DECADRON) injection  8 mg Intravenous TID   enoxaparin (LOVENOX) injection  40 mg Subcutaneous Q24H   insulin aspart  0-15 Units Subcutaneous TID WC   insulin aspart  5 Units Subcutaneous TID WC   insulin glargine  20 Units Subcutaneous Daily   pantoprazole  40 mg Oral Daily   senna-docusate  2 tablet Oral QHS   sertraline  50 mg Oral Daily   Continuous Infusions:  piperacillin-tazobactam (ZOSYN)  IV 3.375 g (09/20/20 0635)     LOS: 1 day     Jacquelin Hawking, MD [facility] 09/20/2020, 10:34 AM  If 7PM-7AM, please contact night-coverage [email or web address]

## 2020-09-20 NOTE — Progress Notes (Signed)
Subjective: No issues overnight. Still has some left neck and mouth pain.  Objective: Vital signs in last 24 hours: Temp:  [97.9 F (36.6 C)-98.6 F (37 C)] 97.9 F (36.6 C) (06/25 1623) Pulse Rate:  [65-93] 66 (06/25 1623) Resp:  [13-22] 18 (06/25 1623) BP: (126-152)/(87-107) 135/91 (06/25 1620) SpO2:  [95 %-100 %] 100 % (06/25 1623)  Exam: General appearance: alert and cooperative. NAD. Eyes: Pupils are equal, round, reactive to light. Extraocular motion is intact. Ears: Examination of the ears shows normal auricles and external auditory canals bilaterally. Nose: Nasal examination shows normal mucosa, septum, turbinates. Face: Facial examination shows no asymmetry. Palpation of the face elicit no significant tenderness. Mouth: Oral cavity examination shows no mucosal abnormalty. No significant trismus is noted.  The left floor of mouth is soft. No tongue swelling. Neck: Palpation of the neck reveals left submandibular swelling. The trachea is midline. The thyroid is not significantly enlarged. Neuro: Cranial nerves 2-12 are all grossly in tact.   Recent Labs    09/19/20 0112 09/20/20 0021  WBC 11.4* 12.5*  HGB 17.5* 15.4  HCT 51.1 45.3  PLT 214 212   Recent Labs    09/19/20 0112 09/20/20 0021  NA 135 136  K 3.7 3.5  CL 97* 100  CO2 22 22  GLUCOSE 313* 226*  BUN 22* 13  CREATININE 1.19 1.15  CALCIUM 9.5 9.0    Medications: I have reviewed the patient's current medications. Scheduled:  amLODipine  5 mg Oral Daily   atorvastatin  40 mg Oral Daily   dexamethasone (DECADRON) injection  8 mg Intravenous TID   enoxaparin (LOVENOX) injection  40 mg Subcutaneous Q24H   insulin aspart  0-20 Units Subcutaneous TID WC   insulin aspart  5 Units Subcutaneous TID WC   [START ON 09/21/2020] insulin glargine  30 Units Subcutaneous Daily   pantoprazole  40 mg Oral Daily   senna-docusate  2 tablet Oral QHS   sertraline  50 mg Oral Daily   Continuous:   piperacillin-tazobactam (ZOSYN)  IV 3.375 g (09/20/20 1527)    Assessment/Plan: - Acute left submandibular and sublingual sialadenitis. Clinically improved with less swelling/induration.   - Continue with IV abx/steroid.   - IV hydration   - Warm compresses   - sialogogues - Incidental finding of a small 51mm vocal cord polyp.   - Likely will need surgical removal. Can be arranged as an outpatient.   LOS: 1 day   Teonna Coonan W Trevaughn Schear 09/20/2020, 6:04 PM

## 2020-09-20 NOTE — Plan of Care (Signed)

## 2020-09-21 DIAGNOSIS — E1165 Type 2 diabetes mellitus with hyperglycemia: Secondary | ICD-10-CM

## 2020-09-21 DIAGNOSIS — I10 Essential (primary) hypertension: Secondary | ICD-10-CM

## 2020-09-21 LAB — CBC WITH DIFFERENTIAL/PLATELET
Abs Immature Granulocytes: 0.07 10*3/uL (ref 0.00–0.07)
Basophils Absolute: 0 10*3/uL (ref 0.0–0.1)
Basophils Relative: 0 %
Eosinophils Absolute: 0 10*3/uL (ref 0.0–0.5)
Eosinophils Relative: 0 %
HCT: 43.7 % (ref 39.0–52.0)
Hemoglobin: 15 g/dL (ref 13.0–17.0)
Immature Granulocytes: 1 %
Lymphocytes Relative: 11 %
Lymphs Abs: 1.5 10*3/uL (ref 0.7–4.0)
MCH: 31 pg (ref 26.0–34.0)
MCHC: 34.3 g/dL (ref 30.0–36.0)
MCV: 90.3 fL (ref 80.0–100.0)
Monocytes Absolute: 0.4 10*3/uL (ref 0.1–1.0)
Monocytes Relative: 3 %
Neutro Abs: 11.4 10*3/uL — ABNORMAL HIGH (ref 1.7–7.7)
Neutrophils Relative %: 85 %
Platelets: 244 10*3/uL (ref 150–400)
RBC: 4.84 MIL/uL (ref 4.22–5.81)
RDW: 12.2 % (ref 11.5–15.5)
WBC: 13.4 10*3/uL — ABNORMAL HIGH (ref 4.0–10.5)
nRBC: 0 % (ref 0.0–0.2)

## 2020-09-21 LAB — GLUCOSE, CAPILLARY
Glucose-Capillary: 360 mg/dL — ABNORMAL HIGH (ref 70–99)
Glucose-Capillary: 359 mg/dL — ABNORMAL HIGH (ref 70–99)
Glucose-Capillary: 258 mg/dL — ABNORMAL HIGH (ref 70–99)
Glucose-Capillary: 306 mg/dL — ABNORMAL HIGH (ref 70–99)

## 2020-09-21 LAB — BASIC METABOLIC PANEL
Anion gap: 10 (ref 5–15)
BUN: 20 mg/dL (ref 6–20)
CO2: 27 mmol/L (ref 22–32)
Calcium: 9.3 mg/dL (ref 8.9–10.3)
Chloride: 99 mmol/L (ref 98–111)
Creatinine, Ser: 1.02 mg/dL (ref 0.61–1.24)
GFR, Estimated: >60 mL/min (ref >60)
Glucose, Bld: 357 mg/dL — ABNORMAL HIGH (ref 70–99)
Potassium: 3.7 mmol/L (ref 3.5–5.1)
Sodium: 136 mmol/L (ref 135–145)

## 2020-09-21 MED ORDER — INSULIN GLARGINE 100 UNIT/ML ~~LOC~~ SOLN
45.0000 [IU] | Freq: Every day | SUBCUTANEOUS | Status: DC
  Administered 2020-09-21 – 2020-09-22 (×2): 45 [IU] via SUBCUTANEOUS
  Filled 2020-09-21 (×2): qty 0.45

## 2020-09-21 MED ORDER — INSULIN ASPART 100 UNIT/ML IJ SOLN
10.0000 [IU] | Freq: Three times a day (TID) | INTRAMUSCULAR | Status: DC
  Administered 2020-09-21 – 2020-09-22 (×3): 10 [IU] via SUBCUTANEOUS

## 2020-09-21 NOTE — Progress Notes (Signed)
PROGRESS NOTE    [name]  OEU:[id number] DOB: [birth year] DOA: 09/18/2020 PCP: Sherron Monday, MD   Brief Narrative: [name] is a 45 y.o. male with a history of diabetes mellitus, hypertension. Patient presented secondary to worsening jaw pain and found to have evidence of acute sialoadenitis with cellulitis. Started on IV antibiotics. ENT consulted with recommendations for continued antibiotics.   Assessment & Plan:   Principal Problem:   Acute sialoadenitis Active Problems:   Diabetes mellitus type 2, uncontrolled (HCC)   Primary hypertension   Acute left submandibular/sublingual sialoadenitis Cellulitis CT scan significant for sialoadenitis with associated regional cellulitis. No significant cellulitis on clinical exam. Significant pain. ENT consulted and no role for surgery and with recommendations for continued antibiotics. -Continue Zosyn and Vancomycin per ENT recommendations -Follow-up blood cultures  Diabetes mellitus, type 2 Uncontrolled with hyperglycemia. Patient is on Lantus, Marcelline Deist, Ozempic as an outpatient. Hemoglobin A1C of 11.8% . Started on SSI while inpatient -Increase to Lantus 45 units daily while on steroids -Continue resistant scale SSI qAC while on steroids -Increase to Novolog 10 units TID with meals  AKI Ruled out. Does not meet criteria for AKI.  Primary hypertension -Continue amlodipine  History of LE osteomyelitis Not on antibiotics. Follows with ID.  Vocal cord polyp 3 mm. Per ENT Dr. Suszanne Conners, will need surgical removal which can be arranged as an outpatient.   DVT prophylaxis: Lovenox Code Status:   Code Status: Full Code Family Communication: None at bedside Disposition Plan: Discharge likely home in 1-2 days pending culture results, transition to oral antibiotics per ENT   Consultants:  ENT  Procedures:  None  Antimicrobials: Zosyn Vancomycin    Subjective: Overall pain and swelling has improved.  Slight worsening but not much.  Objective: Vitals:   09/20/20 1623 09/20/20 1900 09/21/20 0710 09/21/20 0712  BP:  132/79 (!) 150/97 (!) 150/97  Pulse: 66   70  Resp: 18   18  Temp: 97.9 F (36.6 C) 97.8 F (36.6 C)  97.8 F (36.6 C)  TempSrc: Oral Oral  Oral  SpO2: 100%   97%  Weight:      Height:        Intake/Output Summary (Last 24 hours) at 09/21/2020 0908 Last data filed at 09/21/2020 0800 Gross per 24 hour  Intake 480 ml  Output --  Net 480 ml    Filed Weights   09/19/20 0400 09/19/20 0618  Weight: 108.9 kg 103.6 kg    Examination:  General exam: Appears calm and comfortable HEENT: Significant decrease in submandibular swelling/induration Respiratory system: Clear to auscultation. Respiratory effort normal. Cardiovascular system: S1 & S2 heard, RRR. No murmurs, rubs, gallops or clicks. Gastrointestinal system: Abdomen is nondistended, soft and nontender. No organomegaly or masses felt. Normal bowel sounds heard. Central nervous system: Alert and oriented. No focal neurological deficits. Musculoskeletal: No edema. No calf tenderness Skin: No cyanosis. No rashes Psychiatry: Judgement and insight appear normal. Mood & affect appropriate.   Data Reviewed: I have personally reviewed following labs and imaging studies  CBC Lab Results  Component Value Date   WBC 13.4 (H) 09/21/2020   RBC 4.84 09/21/2020   HGB 15.0 09/21/2020   HCT 43.7 09/21/2020   MCV 90.3 09/21/2020   MCH 31.0 09/21/2020   PLT 244 09/21/2020   MCHC 34.3 09/21/2020   RDW 12.2 09/21/2020   LYMPHSABS 1.5 09/21/2020   MONOABS 0.4 09/21/2020   EOSABS 0.0 09/21/2020   BASOSABS 0.0 09/21/2020  Last metabolic panel Lab Results  Component Value Date   NA 136 09/21/2020   K 3.7 09/21/2020   CL 99 09/21/2020   CO2 27 09/21/2020   BUN 20 09/21/2020   CREATININE 1.02 09/21/2020   GLUCOSE 357 (H) 09/21/2020   GFRNONAA >60 09/21/2020   GFRAA >60 07/09/2019   CALCIUM 9.3 09/21/2020    PHOS 4.4 09/19/2020   PROT 7.1 01/25/2019   ALBUMIN 4.1 01/25/2019   BILITOT 0.8 01/25/2019   ALKPHOS 108 01/25/2019   AST 18 01/25/2019   ALT 34 01/25/2019   ANIONGAP 10 09/21/2020    CBG (last 3)  Recent Labs    09/20/20 1625 09/20/20 2113 09/21/20 0617  GLUCAP 335* 386* 360*      GFR: Estimated Creatinine Clearance: 113.7 mL/min (by C-G formula based on SCr of 1.02 mg/dL).  Coagulation Profile: No results for input(s): INR, PROTIME in the last 168 hours.  Recent Results (from the past 240 hour(s))  Resp Panel by RT-PCR (Flu A&B, Covid) Nasopharyngeal Swab     Status: None   Collection Time: 09/19/20  4:12 AM   Specimen: Nasopharyngeal Swab; Nasopharyngeal(NP) swabs in vial transport medium  Result Value Ref Range Status   SARS Coronavirus 2 by RT PCR NEGATIVE NEGATIVE Final    Comment: (NOTE) SARS-CoV-2 target nucleic acids are NOT DETECTED.  The SARS-CoV-2 RNA is generally detectable in upper respiratory specimens during the acute phase of infection. The lowest concentration of SARS-CoV-2 viral copies this assay can detect is 138 copies/mL. A negative result does not preclude SARS-Cov-2 infection and should not be used as the sole basis for treatment or other patient management decisions. A negative result may occur with  improper specimen collection/handling, submission of specimen other than nasopharyngeal swab, presence of viral mutation(s) within the areas targeted by this assay, and inadequate number of viral copies(<138 copies/mL). A negative result must be combined with clinical observations, patient history, and epidemiological information. The expected result is Negative.  Fact Sheet for Patients:  BloggerCourse.com  Fact Sheet for Healthcare Providers:  SeriousBroker.it  This test is no t yet approved or cleared by the [place] FDA and  has been authorized for detection and/or diagnosis of  SARS-CoV-2 by FDA under an Emergency Use Authorization (EUA). This EUA will remain  in effect (meaning this test can be used) for the duration of the COVID-19 declaration under Section 564(b)(1) of the Act, 21 U.S.C.section 360bbb-3(b)(1), unless the authorization is terminated  or revoked sooner.       Influenza A by PCR NEGATIVE NEGATIVE Final   Influenza B by PCR NEGATIVE NEGATIVE Final    Comment: (NOTE) The Xpert Xpress SARS-CoV-2/FLU/RSV plus assay is intended as an aid in the diagnosis of influenza from Nasopharyngeal swab specimens and should not be used as a sole basis for treatment. Nasal washings and aspirates are unacceptable for Xpert Xpress SARS-CoV-2/FLU/RSV testing.  Fact Sheet for Patients: BloggerCourse.com  Fact Sheet for Healthcare Providers: SeriousBroker.it  This test is not yet approved or cleared by the [place] FDA and has been authorized for detection and/or diagnosis of SARS-CoV-2 by FDA under an Emergency Use Authorization (EUA). This EUA will remain in effect (meaning this test can be used) for the duration of the COVID-19 declaration under Section 564(b)(1) of the Act, 21 U.S.C. section 360bbb-3(b)(1), unless the authorization is terminated or revoked.  Performed at [facility], [address] [place], [place] [address]   Culture, blood (routine x 2)  Status: None (Preliminary result)   Collection Time: 09/19/20  5:23 AM   Specimen: BLOOD  Result Value Ref Range Status   Specimen Description BLOOD RIGHT ANTECUBITAL  Final   Special Requests   Final    BOTTLES DRAWN AEROBIC AND ANAEROBIC Blood Culture adequate volume   Culture   Final    NO GROWTH 2 DAYS Performed at [facility], [address] [place], [place] [address]    Report Status PENDING  Incomplete  Culture, blood (routine x 2)     Status: None (Preliminary result)   Collection Time: 09/19/20  5:30 AM    Specimen: BLOOD LEFT HAND  Result Value Ref Range Status   Specimen Description BLOOD LEFT HAND  Final   Special Requests   Final    BOTTLES DRAWN AEROBIC AND ANAEROBIC Blood Culture adequate volume   Culture   Final    NO GROWTH 2 DAYS Performed at [facility], [address], [place], [place] [address]    Report Status PENDING  Incomplete  MRSA Next Gen by PCR, Nasal     Status: None   Collection Time: 09/19/20  6:21 AM   Specimen: Nasal Mucosa; Nasal Swab  Result Value Ref Range Status   MRSA by PCR Next Gen NOT DETECTED NOT DETECTED Final    Comment: (NOTE) The GeneXpert MRSA Assay (FDA approved for NASAL specimens only), is one component of a comprehensive MRSA colonization surveillance program. It is not intended to diagnose MRSA infection nor to guide or monitor treatment for MRSA infections. Test performance is not FDA approved in patients less than 47 years old. Performed at [facility], [address] [place], [place] [address]          Radiology Studies: No results found.      Scheduled Meds:  amLODipine  5 mg Oral Daily   atorvastatin  40 mg Oral Daily   dexamethasone (DECADRON) injection  8 mg Intravenous TID   enoxaparin (LOVENOX) injection  40 mg Subcutaneous Q24H   insulin aspart  0-20 Units Subcutaneous TID WC   insulin aspart  10 Units Subcutaneous TID WC   insulin glargine  45 Units Subcutaneous Daily   pantoprazole  40 mg Oral Daily   senna-docusate  2 tablet Oral QHS   sertraline  50 mg Oral Daily   Continuous Infusions:  piperacillin-tazobactam (ZOSYN)  IV 3.375 g (09/21/20 4128)     LOS: 2 days     Jacquelin Hawking, MD [facility] 09/21/2020, 9:08 AM  If 7PM-7AM, please contact night-coverage [email or web address]

## 2020-09-21 NOTE — Plan of Care (Signed)

## 2020-09-21 NOTE — Progress Notes (Signed)
Pharmacy Antibiotic Note  [name] is a 45 y.o. male admitted on 09/18/2020 with left submandibular/sublingual sialoadenitis.  Pharmacy has been consulted for Zosyn dosing.  ID: acute submandibular cellulitis. Having mild tongue swelling>>steroids. Improving. - Afebrile, WBC 13.4 up (on steroids),Scr 1, cultures remain negative  Vanco 6/24>>6/24 Zosyn 6/24>>  6/24: BC x 2>> 6/24: MRSA PCR: neg  Plan: Zosyn 3.375g IV q 8 hours (extended infusion) Discharge likely home in 1-2 days pending culture results, transition to oral antibiotics per ENT Pharmacy will sign off. Please reconsult for further dosing assitance.     Height: 5\' 10"  (177.8 cm) Weight: 103.6 kg (228 lb 6.3 oz) IBW/kg (Calculated) : 73  Temp (24hrs), Avg:97.8 F (36.6 C), Min:97.8 F (36.6 C), Max:97.9 F (36.6 C)  Recent Labs  Lab 09/19/20 0112 09/20/20 0021 09/21/20 0041  WBC 11.4* 12.5* 13.4*  CREATININE 1.19 1.15 1.02    Estimated Creatinine Clearance: 113.7 mL/min (by C-G formula based on SCr of 1.02 mg/dL).    No Known Allergies  Josephine Wooldridge S. 09/23/20, PharmD, BCPS Clinical Staff Pharmacist [email or web address]   Merilynn Finland 09/21/2020 2:27 PM

## 2020-09-22 ENCOUNTER — Other Ambulatory Visit (HOSPITAL_COMMUNITY): Payer: Self-pay

## 2020-09-22 LAB — CBC WITH DIFFERENTIAL/PLATELET
Abs Immature Granulocytes: 0.18 K/uL — ABNORMAL HIGH (ref 0.00–0.07)
Basophils Absolute: 0 K/uL (ref 0.0–0.1)
Basophils Relative: 0 %
Eosinophils Absolute: 0 K/uL (ref 0.0–0.5)
Eosinophils Relative: 0 %
HCT: 44 % (ref 39.0–52.0)
Hemoglobin: 15.2 g/dL (ref 13.0–17.0)
Immature Granulocytes: 1 %
Lymphocytes Relative: 8 %
Lymphs Abs: 1.2 K/uL (ref 0.7–4.0)
MCH: 30.8 pg (ref 26.0–34.0)
MCHC: 34.5 g/dL (ref 30.0–36.0)
MCV: 89.2 fL (ref 80.0–100.0)
Monocytes Absolute: 0.5 K/uL (ref 0.1–1.0)
Monocytes Relative: 3 %
Neutro Abs: 14 K/uL — ABNORMAL HIGH (ref 1.7–7.7)
Neutrophils Relative %: 88 %
Platelets: 276 K/uL (ref 150–400)
RBC: 4.93 MIL/uL (ref 4.22–5.81)
RDW: 11.9 % (ref 11.5–15.5)
WBC: 15.9 K/uL — ABNORMAL HIGH (ref 4.0–10.5)
nRBC: 0 % (ref 0.0–0.2)

## 2020-09-22 LAB — BASIC METABOLIC PANEL WITH GFR
Anion gap: 8 (ref 5–15)
BUN: 22 mg/dL — ABNORMAL HIGH (ref 6–20)
CO2: 29 mmol/L (ref 22–32)
Calcium: 9.1 mg/dL (ref 8.9–10.3)
Chloride: 99 mmol/L (ref 98–111)
Creatinine, Ser: 1.05 mg/dL (ref 0.61–1.24)
GFR, Estimated: >60 mL/min
Glucose, Bld: 305 mg/dL — ABNORMAL HIGH (ref 70–99)
Potassium: 3.3 mmol/L — ABNORMAL LOW (ref 3.5–5.1)
Sodium: 136 mmol/L (ref 135–145)

## 2020-09-22 LAB — GLUCOSE, CAPILLARY: Glucose-Capillary: 304 mg/dL — ABNORMAL HIGH (ref 70–99)

## 2020-09-22 MED ORDER — POTASSIUM CHLORIDE CRYS ER 10 MEQ PO TBCR
40.0000 meq | EXTENDED_RELEASE_TABLET | Freq: Once | ORAL | Status: AC
  Administered 2020-09-22: 40 meq via ORAL
  Filled 2020-09-22: qty 4

## 2020-09-22 MED ORDER — AMOXICILLIN-POT CLAVULANATE 875-125 MG PO TABS
1.0000 | ORAL_TABLET | Freq: Two times a day (BID) | ORAL | 0 refills | Status: AC
  Filled 2020-09-22: qty 20, 10d supply, fill #0

## 2020-09-22 MED ORDER — OXYCODONE HCL 5 MG PO TABS
5.0000 mg | ORAL_TABLET | ORAL | 0 refills | Status: DC | PRN
  Filled 2020-09-22: qty 20, 2d supply, fill #0

## 2020-09-22 NOTE — Plan of Care (Signed)

## 2020-09-22 NOTE — Discharge Instructions (Signed)
[name].  You were in the hospital with an infection of your salivary glands. This was treated with antibiotics and steroids and has improved. Please continue your antibiotics on discharge.

## 2020-09-22 NOTE — Discharge Summary (Signed)
Physician Discharge Summary  ADAIN GEURIN HYW:[id number] DOB: 14-Feb-1976 DOA: 09/18/2020  PCP: Sherron Monday, MD  Admit date: 09/18/2020 Discharge date: 09/22/2020  Admitted From: Home Disposition: Home  Recommendations for Outpatient Follow-up:  Follow up with PCP in 1 week Follow up with ENT in 1 week Please follow up on the following pending results: None  Home Health: None Equipment/Devices: None  Discharge Condition: Stable CODE STATUS: Full code Diet recommendation: Carb modified   Brief/Interim Summary:  Admission HPI written by Darlin Drop, DO   HPI: MAZE CORNIEL is a 45 y.o. male with medical history significant for MRSA infection, left lower extremity osteomyelitis in 2015, uncontrolled type 2 diabetes, essential hypertension, who presented to [facility] ED from home due to worsening pain and edema of left-sided cheek and neck.  Noted swelling 3 days ago, associated with progressive and worsening discomfort.  Has been self-medicating at home with oral rinses/gargles and over-the-counter NSAIDs.  Pain is exacerbated by using the left side of his mouth.  Denies dysphagia.  The pain is improved with home naproxen.  Admits to intermittent night sweats for the past 3 days.  Has not seen any pus or felt any pus come out of the affected region.  Due to severe pain he presented to the ED for further evaluation and management.  Work-up in the ED revealed acute left submandibular sublingual sialoadenitis seen on CT soft tissue neck with contrast.  EDP discussed with on call ENT, Dr. Suszanne Conners, who will see in consultation.  Patient was started on Unasyn empirically.  Per EDP ENT recommended to hold off Decadron due to uncontrolled diabetes with hyperglycemia.  Added IV vancomycin due to history of MRSA infection.  MRSA screen ordered.  TRH, hospitalist team, has been asked to admit.   Hospital course:  Acute left submandibular/sublingual sialoadenitis Cellulitis CT scan  significant for sialoadenitis with associated regional cellulitis. No significant cellulitis on clinical exam. Significant pain. ENT consulted and no role for surgery and with recommendations for continued antibiotics. Patient was managed with Zosyn and Vancomycin IV. Dexamethasone IV added for swelling. Patient continued to improve and on day of discharge, day 3, transitioned to Augmentin 875-125mg  with a 10 day supply.   Diabetes mellitus, type 2 Uncontrolled with hyperglycemia. Patient is on Lantus, Marcelline Deist, Ozempic as an outpatient. Hemoglobin A1C of 11.8% . Started on SSI while inpatient. Blood sugar was very difficult to control secondary to high dose dexamethasone IV. Patient required as much as Lantus 45 units daily with an increase to Novolog 10 units TID with meals with sliding scale dosing on top. Dexamethasone discontinued prior to discharge. Resume home regimen. -Increase to Lantus 45 units daily while on steroids -Continue resistant scale SSI qAC while on steroids -Increase to Novolog 10 units TID with meals   AKI Ruled out. Does not meet criteria for AKI.   Primary hypertension Amlodipine started secondary to holding home chlorthalidone and benazepril. Can continue home regimen on discharge.   History of LE osteomyelitis Not on antibiotics. Follows with ID.   Vocal cord polyp 3 mm. Per ENT Dr. Suszanne Conners, will need surgical removal which can be arranged as an outpatient.  Discharge Diagnoses:  Principal Problem:   Acute sialoadenitis Active Problems:   Diabetes mellitus type 2, uncontrolled (HCC)   Primary hypertension    Discharge Instructions  Discharge Instructions     Call MD for:  severe uncontrolled pain   Complete by: As directed    Call MD for:  temperature >100.4   Complete by: As directed       Allergies as of 09/22/2020   No Known Allergies      Medication List     STOP taking these medications    traMADol 50 MG tablet Commonly known as: ULTRAM        TAKE these medications    acetaminophen 325 MG tablet Commonly known as: Tylenol Take 2 tablets (650 mg total) by mouth every 6 (six) hours as needed.   amoxicillin-clavulanate 875-125 MG tablet Commonly known as: Augmentin Take 1 tablet by mouth 2 (two) times daily for 10 days.   aspirin 81 MG tablet Take 81 mg by mouth daily.   benazepril 40 MG tablet Commonly known as: LOTENSIN Take 40 mg by mouth every morning.   chlorthalidone 25 MG tablet Commonly known as: HYGROTON Take 25 mg by mouth daily.   Farxiga 10 MG Tabs tablet Generic drug: dapagliflozin propanediol Take 10 mg by mouth every morning.   insulin glargine 100 UNIT/ML Solostar Pen Commonly known as: LANTUS Inject 25 Units into the skin daily.   metFORMIN 1000 MG tablet Commonly known as: GLUCOPHAGE Take 1 tablet by mouth 2 (two) times daily.   naproxen 500 MG tablet Commonly known as: NAPROSYN Take 500 mg by mouth 2 (two) times daily as needed for mild pain or headache.   oxyCODONE 5 MG immediate release tablet Commonly known as: Oxy IR/ROXICODONE Take 1-2 tablets (5-10 mg total) by mouth every 4 (four) hours as needed for moderate pain or breakthrough pain. What changed:  medication strength how much to take how to take this when to take this reasons to take this additional instructions Another medication with the same name was removed. Continue taking this medication, and follow the directions you see here.   Ozempic (1 MG/DOSE) 2 MG/1.5ML Sopn Generic drug: Semaglutide (1 MG/DOSE) Inject 1.5 mg into the skin once a week.   pregabalin 150 MG capsule Commonly known as: LYRICA Take 150 mg by mouth 2 (two) times daily.        Follow-up Information     Newman Pies, MD Follow up.   Specialty: Otolaryngology Why: For hospital follow-up Contact information: [address] [place] [address] [phone]         Sherron Monday, MD. Schedule an appointment as soon as  possible for a visit.   Specialty: Internal Medicine Why: For hospital follow-up Contact information: [address] [place] [address] [phone]                No Known Allergies  Consultations: ENT, Dr. Suszanne Conners   Procedures/Studies: CT Soft Tissue Neck W Contrast  Result Date: 09/19/2020 CLINICAL DATA:  Initial evaluation for acute left-sided neck pain, swelling. EXAM: CT NECK WITH CONTRAST TECHNIQUE: Multidetector CT imaging of the neck was performed using the standard protocol following the bolus administration of intravenous contrast. CONTRAST:  75mL OMNIPAQUE IOHEXOL 300 MG/ML  SOLN COMPARISON:  None. FINDINGS: Pharynx and larynx: Palatine tonsils symmetric and within normal limits. No discrete tonsillar or peritonsillar abscess. Remainder of the oropharynx and nasopharynx within normal limits. No retropharyngeal collection or swelling. Epiglottis normal. Remainder of the hypopharynx and supraglottic larynx within normal limits. There is a possible 3 mm polypoid projection extending from the right true vocal cord near the anterior commissure (series 3, image 90 on axial sequence, series 7, image 73 on coronal sequence). Glottis otherwise unremarkable. Subglottic airway patent and clear. Salivary glands: Parotid glands  within normal limits. Right submandibular gland normal. On the left, there is slight asymmetric enlargement with heterogeneity of the left submandibular gland, suspicious for acute sialoadenitis. The left submandibular duct is mildly dilated. Serpiginous hypodensities at the level of the left floor of mouth and sublingual space favored to reflect tortuosity of the dilated left submandibular duct, although associated abscess formation is difficult to exclude. Area of involvement measures approximately 2.4 cm in greatest dimension (series 7, image 49). The left sublingual gland appears to be enlarged and inflamed as well. The oral tongue appears somewhat lifted in the  oral cavity. Associated swelling with inflammatory stranding noted within the adjacent left greater than right submandibular spaces and submental region, consistent with regional cellulitis. Thyroid: Normal. Lymph nodes: Mildly prominent level I a and IB nodes noted, largest of which seen at left level IB and measures 8 mm. Findings presumably reactive. No other enlarged or pathologic adenopathy within the neck. Vascular: Normal intravascular enhancement seen throughout the neck. Limited intracranial: Unremarkable. Visualized orbits: Unremarkable. Mastoids and visualized paranasal sinuses: Paranasal sinuses are clear. Mastoid air cells and middle ear cavities are well pneumatized and free of fluid. Skeleton: No acute osseous abnormality. No discrete or worrisome osseous lesions. Torus mandibularis noted. Upper chest: Visualized upper chest demonstrates no acute finding. Partially visualized lungs are largely clear. Other: None. IMPRESSION: 1. Findings consistent with acute left submandibular/sublingual sialoadenitis with associated regional cellulitis within the adjacent left greater than right submandibular spaces and submental region. Mild dilatation of the left submandibular duct without obstructive stone. Serpiginous hypodensities at the left sublingual space favored to reflect ductal tortuosity, although associated abscess formation is difficult to exclude. Additionally, there is mild mass effect about the left sublingual space with lifting of the oral tongue, suggesting that this patient could potentially be at risk for airway compromise. Close clinical monitoring recommended. 2. Mildly prominent left-sided cervical adenopathy as above, presumably reactive. 3. Question 3 mm polypoid projection extending from the right true vocal cord near the anterior commissure, which could reflect a small polyp or mass. ENT referral for direct visualization suggested. Electronically Signed   By: Rise Mu M.D.    On: 09/19/2020 03:37      Subjective: Face feels much better. No issues overnight.  Discharge Exam: Vitals:   09/21/20 1937 09/22/20 0745  BP: (!) 145/94 (!) 144/91  Pulse: 73 74  Resp: 16 18  Temp: 98 F (36.7 C) 98 F (36.7 C)  SpO2: 98% 98%   Vitals:   09/21/20 1110 09/21/20 1617 09/21/20 1937 09/22/20 0745  BP: 140/84 132/80 (!) 145/94 (!) 144/91  Pulse: 72 70 73 74  Resp: Temp:  97.8 F (36.6 C) 98 F (36.7 C) 98 F (36.7 C)  TempSrc:  Oral Oral Oral  SpO2:  98% 98% 98%  Weight:      Height:        General: Pt is alert, awake, not in acute distress HEENT: No significant submandibular swelling noted Cardiovascular: RRR, S1/S2 +, no rubs, no gallops Respiratory: CTA bilaterally, no wheezing, no rhonchi Abdominal: Soft, NT, ND, bowel sounds + Extremities: no edema, no cyanosis    The results of significant diagnostics from this hospitalization (including imaging, microbiology, ancillary and laboratory) are listed below for reference.     Microbiology: Recent Results (from the past 240 hour(s))  Resp Panel by RT-PCR (Flu A&B, Covid) Nasopharyngeal Swab     Status: None   Collection Time: 09/19/20  4:12 AM  Specimen: Nasopharyngeal Swab; Nasopharyngeal(NP) swabs in vial transport medium  Result Value Ref Range Status   SARS Coronavirus 2 by RT PCR NEGATIVE NEGATIVE Final    Comment: (NOTE) SARS-CoV-2 target nucleic acids are NOT DETECTED.  The SARS-CoV-2 RNA is generally detectable in upper respiratory specimens during the acute phase of infection. The lowest concentration of SARS-CoV-2 viral copies this assay can detect is 138 copies/mL. A negative result does not preclude SARS-Cov-2 infection and should not be used as the sole basis for treatment or other patient management decisions. A negative result may occur with  improper specimen collection/handling, submission of specimen other than nasopharyngeal swab, presence of viral mutation(s)  within the areas targeted by this assay, and inadequate number of viral copies(<138 copies/mL). A negative result must be combined with clinical observations, patient history, and epidemiological information. The expected result is Negative.  Fact Sheet for Patients:  BloggerCourse.com  Fact Sheet for Healthcare Providers:  SeriousBroker.it  This test is no t yet approved or cleared by the [place] FDA and  has been authorized for detection and/or diagnosis of SARS-CoV-2 by FDA under an Emergency Use Authorization (EUA). This EUA will remain  in effect (meaning this test can be used) for the duration of the COVID-19 declaration under Section 564(b)(1) of the Act, 21 U.S.C.section 360bbb-3(b)(1), unless the authorization is terminated  or revoked sooner.       Influenza A by PCR NEGATIVE NEGATIVE Final   Influenza B by PCR NEGATIVE NEGATIVE Final    Comment: (NOTE) The Xpert Xpress SARS-CoV-2/FLU/RSV plus assay is intended as an aid in the diagnosis of influenza from Nasopharyngeal swab specimens and should not be used as a sole basis for treatment. Nasal washings and aspirates are unacceptable for Xpert Xpress SARS-CoV-2/FLU/RSV testing.  Fact Sheet for Patients: BloggerCourse.com  Fact Sheet for Healthcare Providers: SeriousBroker.it  This test is not yet approved or cleared by the [place] FDA and has been authorized for detection and/or diagnosis of SARS-CoV-2 by FDA under an Emergency Use Authorization (EUA). This EUA will remain in effect (meaning this test can be used) for the duration of the COVID-19 declaration under Section 564(b)(1) of the Act, 21 U.S.C. section 360bbb-3(b)(1), unless the authorization is terminated or revoked.  Performed at [facility], [address] [place], [place] [address]   Culture, blood (routine x 2)     Status: None  (Preliminary result)   Collection Time: 09/19/20  5:23 AM   Specimen: BLOOD  Result Value Ref Range Status   Specimen Description BLOOD RIGHT ANTECUBITAL  Final   Special Requests   Final    BOTTLES DRAWN AEROBIC AND ANAEROBIC Blood Culture adequate volume   Culture   Final    NO GROWTH 3 DAYS Performed at [facility], [address] [place], [place] [address]    Report Status PENDING  Incomplete  Culture, blood (routine x 2)     Status: None (Preliminary result)   Collection Time: 09/19/20  5:30 AM   Specimen: BLOOD LEFT HAND  Result Value Ref Range Status   Specimen Description BLOOD LEFT HAND  Final   Special Requests   Final    BOTTLES DRAWN AEROBIC AND ANAEROBIC Blood Culture adequate volume   Culture   Final    NO GROWTH 3 DAYS Performed at [facility], [address], [place], [place] [address]    Report Status PENDING  Incomplete  MRSA Next Gen by PCR, Nasal  Status: None   Collection Time: 09/19/20  6:21 AM   Specimen: Nasal Mucosa; Nasal Swab  Result Value Ref Range Status   MRSA by PCR Next Gen NOT DETECTED NOT DETECTED Final    Comment: (NOTE) The GeneXpert MRSA Assay (FDA approved for NASAL specimens only), is one component of a comprehensive MRSA colonization surveillance program. It is not intended to diagnose MRSA infection nor to guide or monitor treatment for MRSA infections. Test performance is not FDA approved in patients less than 32 years old. Performed at [facility], [address], [place], [place] [address]      Labs: BNP (last 3 results) No results for input(s): BNP in the last 8760 hours. Basic Metabolic Panel: Recent Labs  Lab 09/19/20 0112 09/20/20 0021 09/21/20 0041 09/22/20 0008  NA 135 136 136 136  K 3.7 3.5 3.7 3.3*  CL 97* 100 99 99  CO2 GLUCOSE 313* 226* 357* 305*  BUN 22* 13 20 22*  CREATININE 1.19 1.15 1.02 1.05  CALCIUM 9.5 9.0 9.3 9.1  MG 1.9  --   --   --   PHOS 4.4  --   --   --     Liver Function Tests: No results for input(s): AST, ALT, ALKPHOS, BILITOT, PROT, ALBUMIN in the last 168 hours. No results for input(s): LIPASE, AMYLASE in the last 168 hours. No results for input(s): AMMONIA in the last 168 hours. CBC: Recent Labs  Lab 09/19/20 0112 09/20/20 0021 09/21/20 0041 09/22/20 0008  WBC 11.4* 12.5* 13.4* 15.9*  NEUTROABS 7.8* 11.1* 11.4* 14.0*  HGB 17.5* 15.4 15.0 15.2  HCT 51.1 45.3 43.7 44.0  MCV 90.1 90.6 90.3 89.2  PLT 214 212 244 276   Cardiac Enzymes: No results for input(s): CKTOTAL, CKMB, CKMBINDEX, TROPONINI in the last 168 hours. BNP: Invalid input(s): POCBNP CBG: Recent Labs  Lab 09/21/20 0617 09/21/20 1106 09/21/20 1615 09/21/20 2115 09/22/20 0619  GLUCAP 360* 359* 258* 306* 304*   D-Dimer No results for input(s): DDIMER in the last 72 hours. Hgb A1c No results for input(s): HGBA1C in the last 72 hours. Lipid Profile No results for input(s): CHOL, HDL, LDLCALC, TRIG, CHOLHDL, LDLDIRECT in the last 72 hours. Thyroid function studies No results for input(s): TSH, T4TOTAL, T3FREE, THYROIDAB in the last 72 hours.  Invalid input(s): FREET3 Anemia work up No results for input(s): VITAMINB12, FOLATE, FERRITIN, TIBC, IRON, RETICCTPCT in the last 72 hours. Urinalysis    Component Value Date/Time   COLORURINE YELLOW 01/25/2019 1325   APPEARANCEUR HAZY (A) 01/25/2019 1325   LABSPEC 1.028 01/25/2019 1325   PHURINE 5.0 01/25/2019 1325   GLUCOSEU NEGATIVE 01/25/2019 1325   HGBUR NEGATIVE 01/25/2019 1325   BILIRUBINUR NEGATIVE 01/25/2019 1325   KETONESUR NEGATIVE 01/25/2019 1325   PROTEINUR NEGATIVE 01/25/2019 1325   NITRITE NEGATIVE 01/25/2019 1325   LEUKOCYTESUR NEGATIVE 01/25/2019 1325   Sepsis Labs Invalid input(s): PROCALCITONIN,  WBC,  LACTICIDVEN Microbiology Recent Results (from the past 240 hour(s))  Resp Panel by RT-PCR (Flu A&B, Covid) Nasopharyngeal Swab     Status: None   Collection Time: 09/19/20  4:12 AM    Specimen: Nasopharyngeal Swab; Nasopharyngeal(NP) swabs in vial transport medium  Result Value Ref Range Status   SARS Coronavirus 2 by RT PCR NEGATIVE NEGATIVE Final    Comment: (NOTE) SARS-CoV-2 target nucleic acids are NOT DETECTED.  The SARS-CoV-2 RNA is generally detectable in upper respiratory specimens during the acute phase of infection. The  lowest concentration of SARS-CoV-2 viral copies this assay can detect is 138 copies/mL. A negative result does not preclude SARS-Cov-2 infection and should not be used as the sole basis for treatment or other patient management decisions. A negative result may occur with  improper specimen collection/handling, submission of specimen other than nasopharyngeal swab, presence of viral mutation(s) within the areas targeted by this assay, and inadequate number of viral copies(<138 copies/mL). A negative result must be combined with clinical observations, patient history, and epidemiological information. The expected result is Negative.  Fact Sheet for Patients:  BloggerCourse.comhttps://www.fda.gov/media/152166/download  Fact Sheet for Healthcare Providers:  SeriousBroker.ithttps://www.fda.gov/media/152162/download  This test is no t yet approved or cleared by the [place] FDA and  has been authorized for detection and/or diagnosis of SARS-CoV-2 by FDA under an Emergency Use Authorization (EUA). This EUA will remain  in effect (meaning this test can be used) for the duration of the COVID-19 declaration under Section 564(b)(1) of the Act, 21 U.S.C.section 360bbb-3(b)(1), unless the authorization is terminated  or revoked sooner.       Influenza A by PCR NEGATIVE NEGATIVE Final   Influenza B by PCR NEGATIVE NEGATIVE Final    Comment: (NOTE) The Xpert Xpress SARS-CoV-2/FLU/RSV plus assay is intended as an aid in the diagnosis of influenza from Nasopharyngeal swab specimens and should not be used as a sole basis for treatment. Nasal washings and aspirates are  unacceptable for Xpert Xpress SARS-CoV-2/FLU/RSV testing.  Fact Sheet for Patients: BloggerCourse.comhttps://www.fda.gov/media/152166/download  Fact Sheet for Healthcare Providers: SeriousBroker.ithttps://www.fda.gov/media/152162/download  This test is not yet approved or cleared by the [place] FDA and has been authorized for detection and/or diagnosis of SARS-CoV-2 by FDA under an Emergency Use Authorization (EUA). This EUA will remain in effect (meaning this test can be used) for the duration of the COVID-19 declaration under Section 564(b)(1) of the Act, 21 U.S.C. section 360bbb-3(b)(1), unless the authorization is terminated or revoked.  Performed at [facility], [address] [place], [place] [address]   Culture, blood (routine x 2)     Status: None (Preliminary result)   Collection Time: 09/19/20  5:23 AM   Specimen: BLOOD  Result Value Ref Range Status   Specimen Description BLOOD RIGHT ANTECUBITAL  Final   Special Requests   Final    BOTTLES DRAWN AEROBIC AND ANAEROBIC Blood Culture adequate volume   Culture   Final    NO GROWTH 3 DAYS Performed at [facility], [address] [place], [place] [address]    Report Status PENDING  Incomplete  Culture, blood (routine x 2)     Status: None (Preliminary result)   Collection Time: 09/19/20  5:30 AM   Specimen: BLOOD LEFT HAND  Result Value Ref Range Status   Specimen Description BLOOD LEFT HAND  Final   Special Requests   Final    BOTTLES DRAWN AEROBIC AND ANAEROBIC Blood Culture adequate volume   Culture   Final    NO GROWTH 3 DAYS Performed at [facility], [address] [place], [place] [address]    Report Status PENDING  Incomplete  MRSA Next Gen by PCR, Nasal     Status: None   Collection Time: 09/19/20  6:21 AM   Specimen: Nasal Mucosa; Nasal Swab  Result Value Ref Range Status   MRSA by PCR Next Gen NOT DETECTED NOT DETECTED Final    Comment: (NOTE) The GeneXpert MRSA Assay (FDA approved for NASAL specimens  only), is one component of  a comprehensive MRSA colonization surveillance program. It is not intended to diagnose MRSA infection nor to guide or monitor treatment for MRSA infections. Test performance is not FDA approved in patients less than 67 years old. Performed at [facility], [address], [place], [place] [address]      Time coordinating discharge: 35 minutes  SIGNED:   Jacquelin Hawking, MD [facility] 09/22/2020, 5:10 PM

## 2020-09-22 NOTE — Progress Notes (Signed)
Subjective: Pain and swelling have improved.  Objective: Vital signs in last 24 hours: Temp:  [97.8 F (36.6 C)-98 F (36.7 C)] 98 F (36.7 C) (06/26 1937) Pulse Rate:  [70-73] 73 (06/26 1937) Resp:  [16-19] 16 (06/26 1937) BP: (132-152)/(80-123) 145/94 (06/26 1937) SpO2:  [97 %-98 %] 98 % (06/26 1937)  Exam: General appearance: alert and cooperative. NAD. Eyes: Pupils are equal, round, reactive to light. Extraocular motion is intact. Ears: Examination of the ears shows normal auricles and external auditory canals bilaterally. Nose: Nasal examination shows normal mucosa, septum, turbinates. Face: Facial examination shows no asymmetry. Palpation of the face elicit no significant tenderness. Mouth: Oral cavity examination shows no mucosal abnormalty. No significant trismus is noted.  The left floor of mouth is soft. No tongue swelling. Neck: Palpation of the neck reveals minimal left submandibular swelling. The trachea is midline. The thyroid is not significantly enlarged. Neuro: Cranial nerves 2-12 are all grossly in tact.     Recent Labs    09/21/20 0041 09/22/20 0008  WBC 13.4* 15.9*  HGB 15.0 15.2  HCT 43.7 44.0  PLT 244 276   Recent Labs    09/21/20 0041 09/22/20 0008  NA 136 136  K 3.7 3.3*  CL 99 99  CO2 27 29  GLUCOSE 357* 305*  BUN 20 22*  CREATININE 1.02 1.05  CALCIUM 9.3 9.1    Medications: I have reviewed the patient's current medications. Scheduled:  amLODipine  5 mg Oral Daily   atorvastatin  40 mg Oral Daily   dexamethasone (DECADRON) injection  8 mg Intravenous TID   enoxaparin (LOVENOX) injection  40 mg Subcutaneous Q24H   insulin aspart  0-20 Units Subcutaneous TID WC   insulin aspart  10 Units Subcutaneous TID WC   insulin glargine  45 Units Subcutaneous Daily   pantoprazole  40 mg Oral Daily   senna-docusate  2 tablet Oral QHS   sertraline  50 mg Oral Daily   Continuous:  piperacillin-tazobactam (ZOSYN)  IV 3.375 g (09/22/20 0541)     Assessment/Plan: - Acute left submandibular and sublingual sialadenitis. Mostly resolved.   - Consider d/c home on oral abx (e.g. 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   - Likely will need surgical removal. Can be arranged as an outpatient. Will refer patient to the [facility] department.   - My office will contact the patient on the referral info.   LOS: 3 days   Eboni Coval W Kaileia Flow 09/22/2020, 7:26 AM

## 2020-09-22 NOTE — Progress Notes (Signed)
Patient discharge instructions reviewed and patient verbalizes understanding.  TOC delivered prescriptions. Patient via wheelchair to his waiting car in stable condition

## 2020-09-22 NOTE — Progress Notes (Signed)
Inpatient Diabetes Program Recommendations  AACE/ADA: New Consensus Statement on Inpatient Glycemic Control (2015)  Target Ranges:  Prepandial:   less than 140 mg/dL      Peak postprandial:   less than 180 mg/dL (1-2 hours)      Critically ill patients:  140 - 180 mg/dL   Lab Results  Component Value Date   GLUCAP 304 (H) 09/22/2020   HGBA1C 11.8 (H) 09/19/2020    Review of Glycemic Control Results for [name], [name] (MRN [id number]) as of 09/22/2020 08:28  Ref. Range 09/21/2020 06:17 09/21/2020 11:06 09/21/2020 16:15 09/21/2020 21:15 09/22/2020 06:19  Glucose-Capillary Latest Ref Range: 70 - 99 mg/dL 426 (H) 834 (H) 196 (H) 306 (H) 304 (H)   Current orders for Inpatient glycemic control:  Lantus 45 units QD, Novolog 0-20 units TID, Novolog 10 units TID and decadron 8 mg TID  Inpatient Diabetes Program Recommendations:     Please consider,  Lantus 30 units BID Novolog 14 units TID with meals if eats at least 50% Novolog 0-5 units QHS  Will continue to follow while inpatient.  Thank you, Dulce Sellar, RN, BSN Diabetes Coordinator Inpatient Diabetes Program [phone] (team pager from 8a-5p)

## 2020-09-24 LAB — CULTURE, BLOOD (ROUTINE X 2)
Culture: NO GROWTH
Culture: NO GROWTH
Special Requests: ADEQUATE
Special Requests: ADEQUATE

## 2021-06-21 IMAGING — CT CT OF THE LEFT FOOT WITHOUT CONTRAST
3 of 6 series · 10 of 36 positions shown, 11 images · non-contrast
Comparison: Radiographs 08/18/2018

CLINICAL DATA: Worsening ulcer on the plantar aspect of the
forefoot.

EXAM:
CT OF THE LEFT FOOT WITHOUT CONTRAST
TECHNIQUE: Multidetector CT imaging of the left foot was performed according to
the standard protocol. Multiplanar CT image reconstructions were
also generated.

[Series 3: axial bone sfov lower extremity · coronal · 0.23mm/px · 3 of 205 slices shown]
[im 39/205  bone]
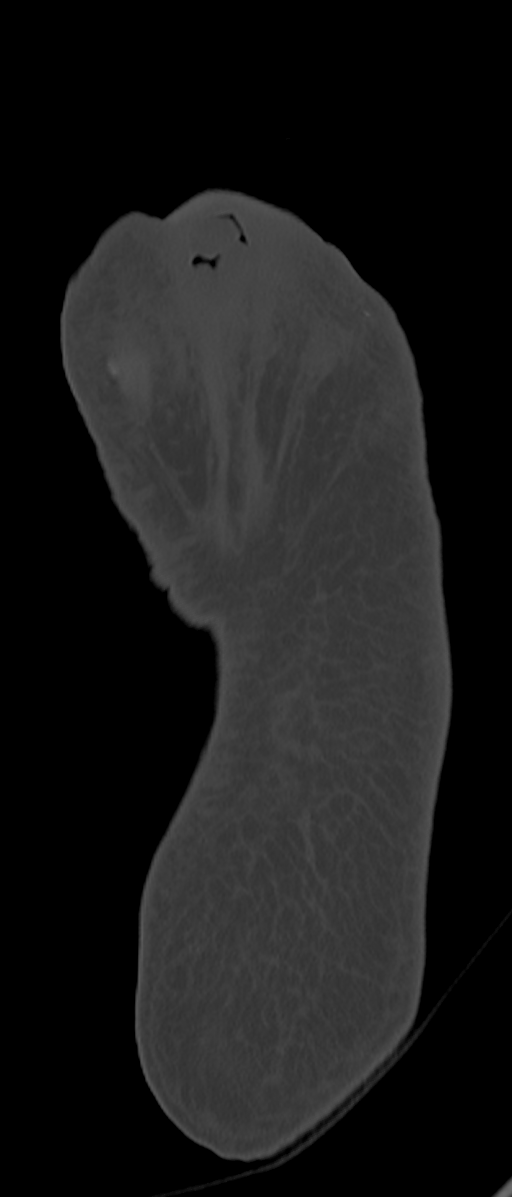
[im 103/205  bone]
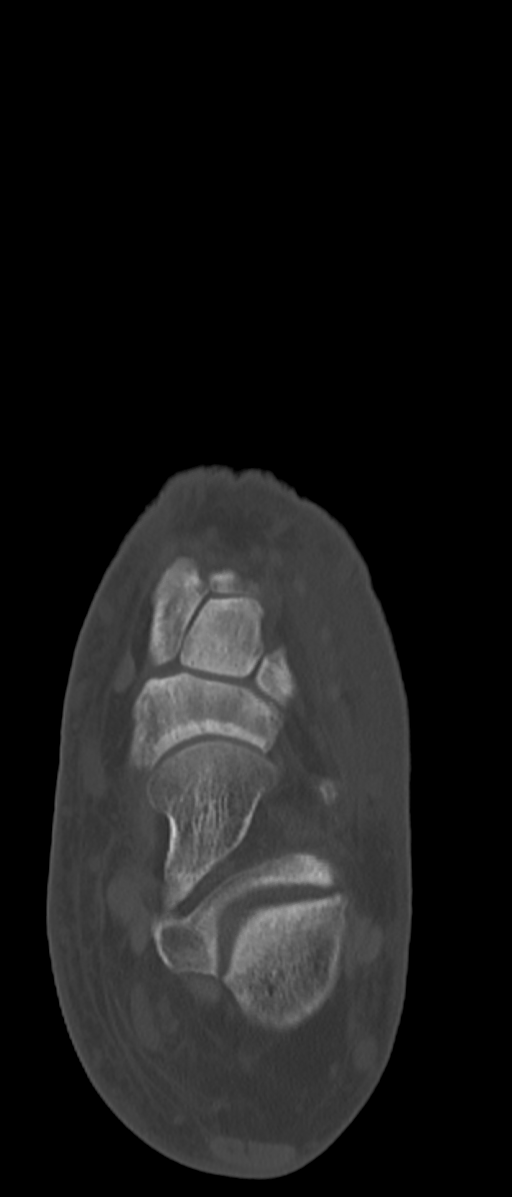
[im 167/205  bone]
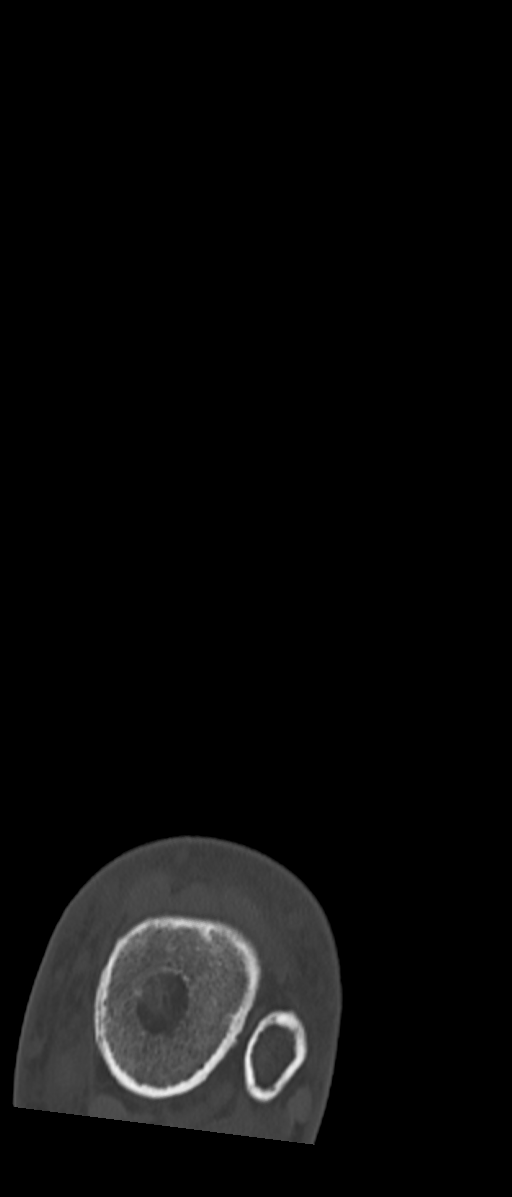

[Series 7: coronal bone sfov lower extremity · axial · 0.23mm/px · z∈[+931,+931]mm · 1 of 353 slices shown, 2 images]
[im 177/353  soft-tissue]
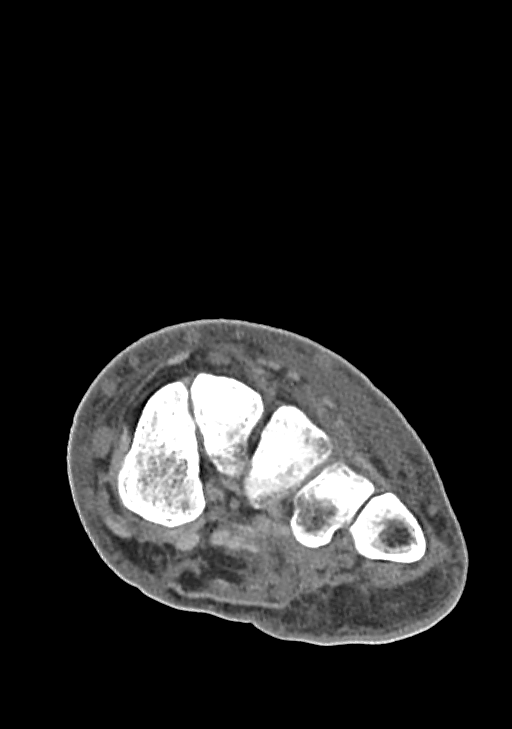
[im 177/353  bone]
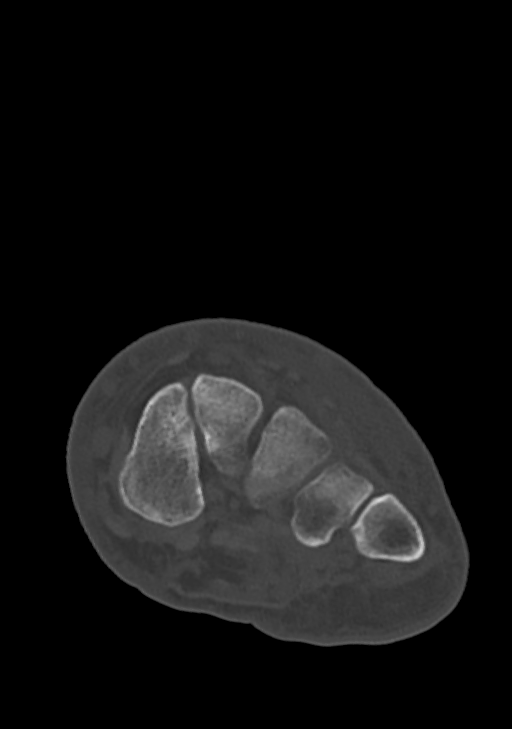

[Series 13: sagittal bone sfov lower extremity · sagittal · 0.32mm/px · 6 of 149 slices shown]
[im 18/149  soft-tissue]
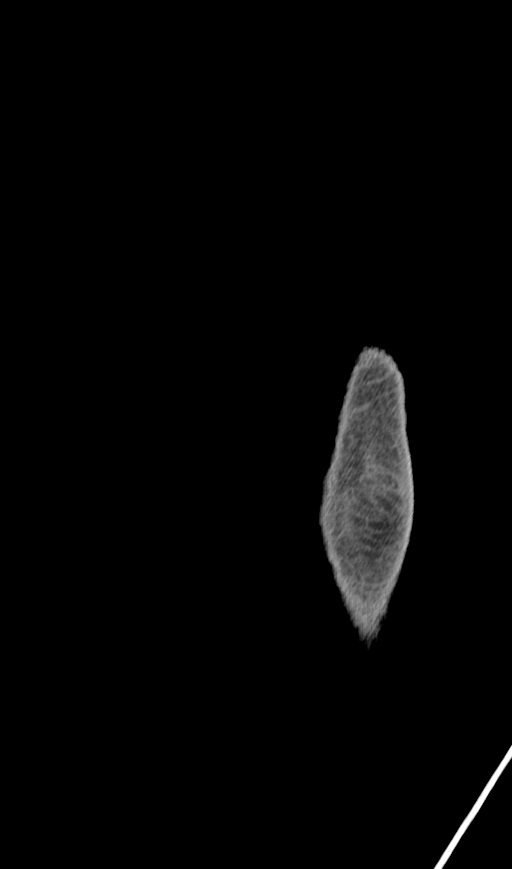
[im 25/149  bone]
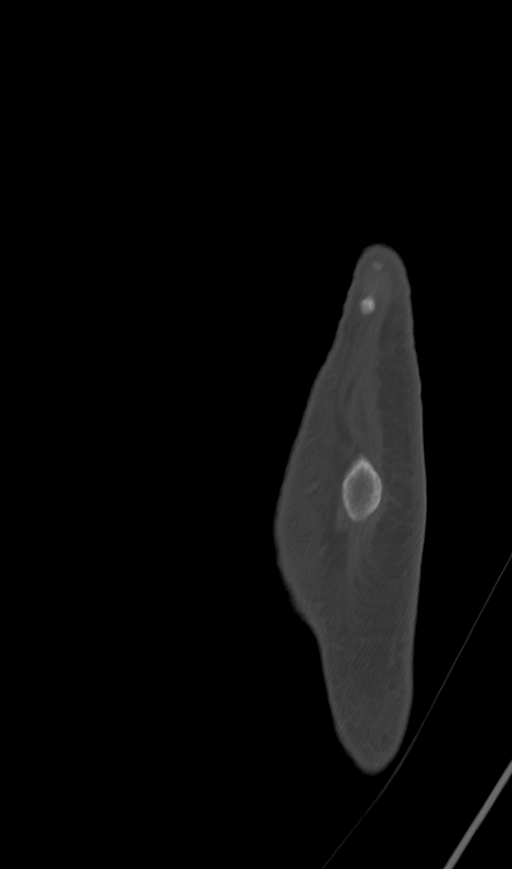
[im 50/149  bone]
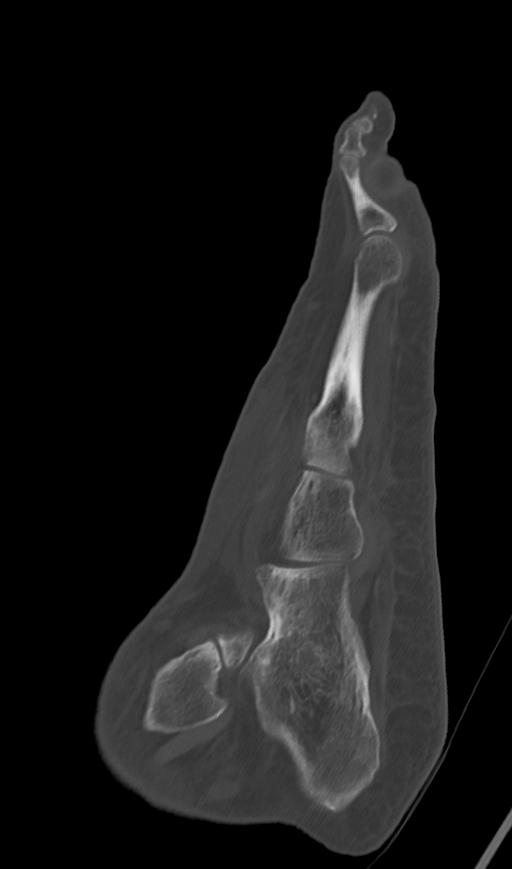
[im 75/149  bone]
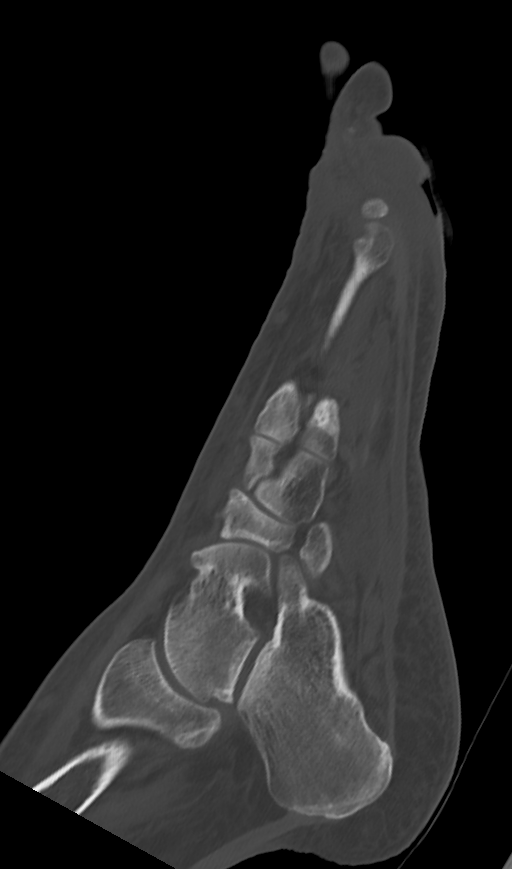
[im 99/149  bone]
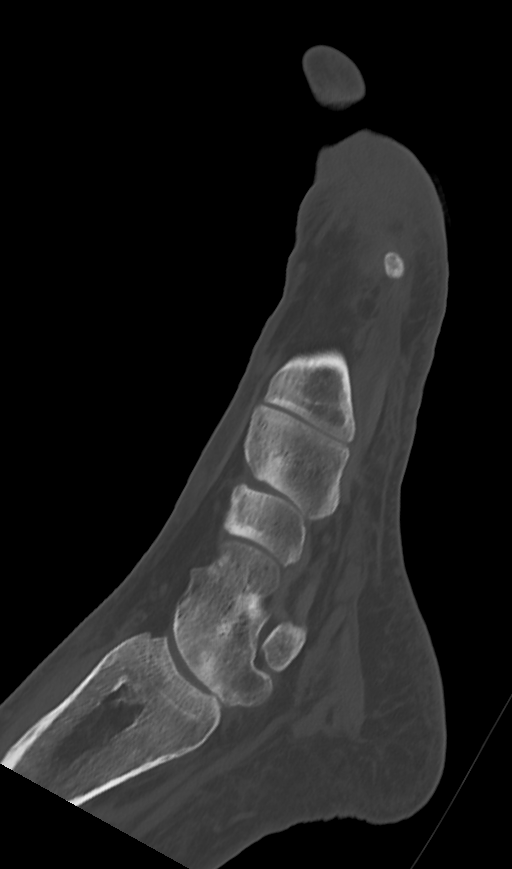
[im 124/149  bone]
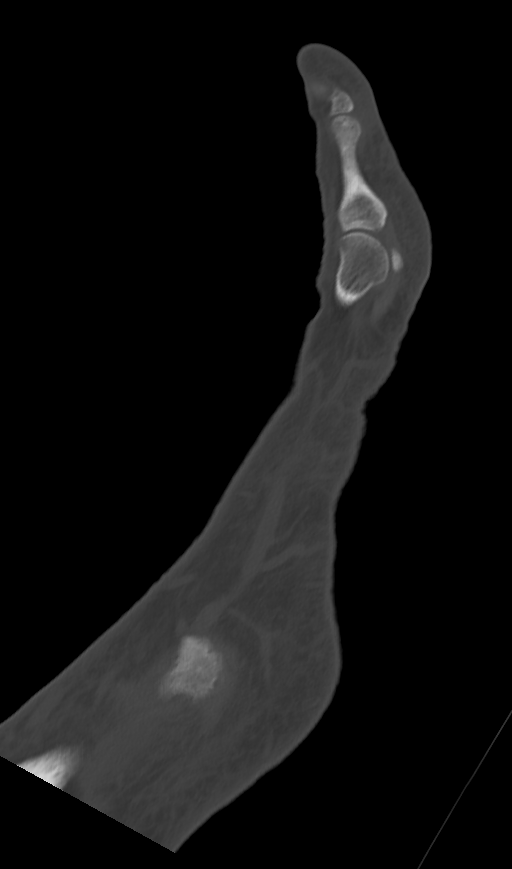

[10 of 36 positions shown; findings below may reference images not displayed]

FINDINGS: There is a large open wound on the plantar aspect of the forefoot at
the level of the metatarsal phalangeal joints. I do not see an
obvious discrete drainable soft tissue abscess. There is significant
surrounding cellulitis. No obvious CT findings for septic arthritis
or osteomyelitis.

The tibiotalar and subtalar joints are maintained. Mild midfoot
degenerative changes but no erosive changes or destructive bony
changes.

Evidence of prior intramedullary rod in the tibia with the distal
interlocking screw.
IMPRESSION: 1. Large open wound on the plantar aspect of the forefoot with
significant surrounding cellulitis but no obvious drainable soft
tissue abscess.
2. No definite CT findings for septic arthritis or osteomyelitis.
However, if patient's symptoms worsen or do not improve MRI would be
helpful for further evaluation.
3. Central lucency in the tibia most consistent with prior
intramedullary rod.

## 2022-04-02 DIAGNOSIS — E118 Type 2 diabetes mellitus with unspecified complications: Secondary | ICD-10-CM | POA: Diagnosis not present

## 2022-04-05 DIAGNOSIS — E785 Hyperlipidemia, unspecified: Secondary | ICD-10-CM | POA: Diagnosis not present

## 2022-04-05 DIAGNOSIS — E669 Obesity, unspecified: Secondary | ICD-10-CM | POA: Diagnosis not present

## 2022-04-05 DIAGNOSIS — M21372 Foot drop, left foot: Secondary | ICD-10-CM | POA: Diagnosis not present

## 2022-04-05 DIAGNOSIS — E118 Type 2 diabetes mellitus with unspecified complications: Secondary | ICD-10-CM | POA: Diagnosis not present

## 2022-04-05 DIAGNOSIS — R109 Unspecified abdominal pain: Secondary | ICD-10-CM | POA: Diagnosis not present

## 2022-04-05 DIAGNOSIS — F321 Major depressive disorder, single episode, moderate: Secondary | ICD-10-CM | POA: Diagnosis not present

## 2022-04-05 DIAGNOSIS — I10 Essential (primary) hypertension: Secondary | ICD-10-CM | POA: Diagnosis not present

## 2022-04-05 DIAGNOSIS — G8929 Other chronic pain: Secondary | ICD-10-CM | POA: Diagnosis not present

## 2022-04-07 DIAGNOSIS — S39012A Strain of muscle, fascia and tendon of lower back, initial encounter: Secondary | ICD-10-CM | POA: Diagnosis not present

## 2022-04-07 DIAGNOSIS — S161XXA Strain of muscle, fascia and tendon at neck level, initial encounter: Secondary | ICD-10-CM | POA: Diagnosis not present

## 2022-04-09 DIAGNOSIS — M546 Pain in thoracic spine: Secondary | ICD-10-CM | POA: Diagnosis not present

## 2022-04-09 DIAGNOSIS — M542 Cervicalgia: Secondary | ICD-10-CM | POA: Diagnosis not present

## 2022-04-26 DIAGNOSIS — E119 Type 2 diabetes mellitus without complications: Secondary | ICD-10-CM | POA: Diagnosis not present

## 2022-04-30 ENCOUNTER — Other Ambulatory Visit: Payer: Self-pay | Admitting: Internal Medicine

## 2022-04-30 DIAGNOSIS — G8929 Other chronic pain: Secondary | ICD-10-CM

## 2022-05-03 ENCOUNTER — Ambulatory Visit: Payer: Medicare PPO | Admitting: Internal Medicine

## 2022-05-04 ENCOUNTER — Other Ambulatory Visit: Payer: Self-pay | Admitting: Internal Medicine

## 2022-05-04 DIAGNOSIS — G8929 Other chronic pain: Secondary | ICD-10-CM

## 2022-05-31 ENCOUNTER — Other Ambulatory Visit: Payer: Self-pay | Admitting: Internal Medicine

## 2022-05-31 DIAGNOSIS — E118 Type 2 diabetes mellitus with unspecified complications: Secondary | ICD-10-CM

## 2022-06-02 ENCOUNTER — Encounter: Payer: Self-pay | Admitting: Internal Medicine

## 2022-06-02 ENCOUNTER — Other Ambulatory Visit: Payer: Self-pay | Admitting: Internal Medicine

## 2022-06-02 ENCOUNTER — Ambulatory Visit (INDEPENDENT_AMBULATORY_CARE_PROVIDER_SITE_OTHER): Payer: Medicare PPO | Admitting: Internal Medicine

## 2022-06-02 VITALS — BP 170/120 | HR 118 | Ht 69.0 in | Wt 236.8 lb

## 2022-06-02 DIAGNOSIS — E119 Type 2 diabetes mellitus without complications: Secondary | ICD-10-CM

## 2022-06-02 DIAGNOSIS — Z794 Long term (current) use of insulin: Secondary | ICD-10-CM | POA: Diagnosis not present

## 2022-06-02 DIAGNOSIS — I10 Essential (primary) hypertension: Secondary | ICD-10-CM

## 2022-06-02 LAB — GLUCOSE, POCT (MANUAL RESULT ENTRY): POC Glucose: 169 mg/dl — AB (ref 70–99)

## 2022-06-02 MED ORDER — OZEMPIC (1 MG/DOSE) 2 MG/1.5ML ~~LOC~~ SOPN: 1.5000 mg | PEN_INJECTOR | SUBCUTANEOUS | 0 refills | Status: DC

## 2022-06-02 MED ORDER — INSULIN GLARGINE 100 UNIT/ML SOLOSTAR PEN: 75.0000 [IU] | PEN_INJECTOR | Freq: Every day | SUBCUTANEOUS | 0 refills | Status: DC

## 2022-06-02 NOTE — Progress Notes (Signed)
Established Patient Office Visit  Subjective:  Patient ID: [name], male    DOB: 10/17/1975  Age: 47 y.o. MRN: [id number]  Chief Complaint  Patient presents with   Follow-up    Follow up    No new complaints, here for lab review and medication refills. Failed to have previsit labs done. BP high but denies noncompliance with medications although admits to dietary salt indiscretion.     Past Medical History:  Diagnosis Date   Diabetes mellitus without complication ([place])    Hypertension     No past surgical history on file.  Social History   Socioeconomic History   Marital status: Single    Spouse name: Not on file   Number of children: Not on file   Years of education: Not on file   Highest education level: Not on file  Occupational History   Not on file  Tobacco Use   Smoking status: Every Day   Smokeless tobacco: Never  Substance and Sexual Activity   Alcohol use: Yes    Alcohol/week: 0.0 standard drinks of alcohol   Drug use: No   Sexual activity: Not on file  Other Topics Concern   Not on file  Social History Narrative   Not on file   Social Determinants of Health   Financial Resource Strain: Not on file  Food Insecurity: Not on file  Transportation Needs: Not on file  Physical Activity: Not on file  Stress: Not on file  Social Connections: Not on file  Intimate Partner Violence: Not on file    No family history on file.  No Known Allergies  Review of Systems  Constitutional: Negative.   HENT: Negative.    Eyes: Negative.   Respiratory: Negative.    Cardiovascular: Negative.   Gastrointestinal: Negative.   Genitourinary: Negative.   Musculoskeletal:  Positive for joint pain.  Skin: Negative.   Neurological: Negative.   Endo/Heme/Allergies: Negative.        Objective:   BP (!) 120/110   Pulse (!) 118   Ht '5\' 9"'$  (1.753 m)   Wt 236 lb 12.8 oz (107.4 kg)   SpO2 95%   BMI 34.97 kg/m   Vitals:   06/02/22 1142  BP: (!)  120/110  Pulse: (!) 118  Height: '5\' 9"'$  (1.753 m)  Weight: 236 lb 12.8 oz (107.4 kg)  SpO2: 95%  BMI (Calculated): 34.95    Physical Exam Vitals reviewed.  Constitutional:      Appearance: Normal appearance.  HENT:     Head: Normocephalic.     Left Ear: There is no impacted cerumen.     Nose: Nose normal.     Mouth/Throat:     Mouth: Mucous membranes are moist.     Pharynx: No posterior oropharyngeal erythema.  Eyes:     Extraocular Movements: Extraocular movements intact.     Pupils: Pupils are equal, round, and reactive to light.  Cardiovascular:     Rate and Rhythm: Regular rhythm.     Chest Wall: PMI is not displaced.     Pulses: Normal pulses.     Heart sounds: Normal heart sounds. No murmur heard. Pulmonary:     Effort: Pulmonary effort is normal.     Breath sounds: Normal air entry. No rhonchi or rales.  Abdominal:     General: Abdomen is flat. Bowel sounds are normal. There is no distension.     Palpations: Abdomen is soft. There is no hepatomegaly, splenomegaly or mass.  Tenderness: There is no abdominal tenderness.  Musculoskeletal:        General: Normal range of motion.     Cervical back: Normal range of motion and neck supple.     Right lower leg: No edema.     Left lower leg: No edema.  Skin:    General: Skin is warm and dry.  Neurological:     General: No focal deficit present.     Mental Status: He is alert and oriented to person, place, and time.     Cranial Nerves: No cranial nerve deficit.     Motor: No weakness.  Psychiatric:        Mood and Affect: Mood normal.        Behavior: Behavior normal.      Results for orders placed or performed in visit on 06/02/22  POCT Glucose (CBG)  Result Value Ref Range   POC Glucose 169 (A) 70 - 99 mg/dl    Recent Results (from the past 2160 hour(Markee Matera))  POCT Glucose (CBG)     Status: Abnormal   Collection Time: 06/02/22 11:53 AM  Result Value Ref Range   POC Glucose 169 (A) 70 - 99 mg/dl       Assessment & Plan:   Problem List Items Addressed This Visit   None Visit Diagnoses     Type 2 diabetes mellitus without complication, with long-term current use of insulin (HCC)    -  Primary   Relevant Medications   olmesartan (BENICAR) 40 MG tablet   Other Relevant Orders   POCT Glucose (CBG) (Completed)       No follow-ups on file.   Total time spent: 20 minutes  Volanda Napoleon, MD  06/02/2022

## 2022-06-03 ENCOUNTER — Other Ambulatory Visit: Payer: Self-pay

## 2022-06-03 ENCOUNTER — Telehealth: Payer: Self-pay

## 2022-06-03 DIAGNOSIS — E119 Type 2 diabetes mellitus without complications: Secondary | ICD-10-CM

## 2022-06-03 NOTE — Telephone Encounter (Signed)
Pt called and left vm requesting a call back regarding pharmacy needing clarification on rx ozempic directions, and needing an rx for needles for lantus

## 2022-06-04 MED ORDER — OZEMPIC (1 MG/DOSE) 2 MG/1.5ML ~~LOC~~ SOPN: 1.0000 mg | PEN_INJECTOR | SUBCUTANEOUS | 0 refills | Status: AC

## 2022-06-07 DIAGNOSIS — E118 Type 2 diabetes mellitus with unspecified complications: Secondary | ICD-10-CM | POA: Diagnosis not present

## 2022-06-11 ENCOUNTER — Other Ambulatory Visit: Payer: Self-pay | Admitting: Internal Medicine

## 2022-06-11 DIAGNOSIS — G8929 Other chronic pain: Secondary | ICD-10-CM

## 2022-06-16 ENCOUNTER — Other Ambulatory Visit: Payer: Self-pay | Admitting: Internal Medicine

## 2022-06-16 DIAGNOSIS — Z794 Long term (current) use of insulin: Secondary | ICD-10-CM

## 2022-06-16 MED ORDER — INSULIN PEN NEEDLE 32G X 4 MM MISC: 1.0000 | Freq: Every day | 3 refills | Status: AC

## 2022-06-21 ENCOUNTER — Encounter: Payer: Self-pay | Admitting: Internal Medicine

## 2022-06-21 ENCOUNTER — Ambulatory Visit (INDEPENDENT_AMBULATORY_CARE_PROVIDER_SITE_OTHER): Payer: Medicare PPO | Admitting: Internal Medicine

## 2022-06-21 VITALS — BP 130/90 | HR 91 | Temp 98.1°F | Ht 69.0 in | Wt 230.6 lb

## 2022-06-21 DIAGNOSIS — Z794 Long term (current) use of insulin: Secondary | ICD-10-CM | POA: Diagnosis not present

## 2022-06-21 DIAGNOSIS — E1165 Type 2 diabetes mellitus with hyperglycemia: Secondary | ICD-10-CM | POA: Diagnosis not present

## 2022-06-21 DIAGNOSIS — I10 Essential (primary) hypertension: Secondary | ICD-10-CM

## 2022-06-21 DIAGNOSIS — E119 Type 2 diabetes mellitus without complications: Secondary | ICD-10-CM | POA: Diagnosis not present

## 2022-06-21 LAB — GLUCOSE, POCT (MANUAL RESULT ENTRY): POC Glucose: 265 mg/dl — AB (ref 70–99)

## 2022-06-21 NOTE — Progress Notes (Signed)
Established Patient Office Visit  Subjective:  Patient ID: [name], male    DOB: [birth year]  Age: 47 y.o. MRN: [id number]  Chief Complaint  Patient presents with   Follow-up    1 week lab results    No new complaints, here for lab review and medication refills. Labs reviewed and notable for well controlled diabetes, A1c at target/not at target and lipids at target with unremarkable cmp. Denies any hypoglycemic episodes and home bg readings have been at target.     No other concerns at this time.   Past Medical History:  Diagnosis Date   Diabetes mellitus without complication ([place])    Hypertension     No past surgical history on file.  Social History   Socioeconomic History   Marital status: Single    Spouse name: Not on file   Number of children: Not on file   Years of education: Not on file   Highest education level: Not on file  Occupational History   Not on file  Tobacco Use   Smoking status: Every Day   Smokeless tobacco: Never  Substance and Sexual Activity   Alcohol use: Yes    Alcohol/week: 0.0 standard drinks of alcohol   Drug use: No   Sexual activity: Not on file  Other Topics Concern   Not on file  Social History Narrative   Not on file   Social Determinants of Health   Financial Resource Strain: Not on file  Food Insecurity: Not on file  Transportation Needs: Not on file  Physical Activity: Not on file  Stress: Not on file  Social Connections: Not on file  Intimate Partner Violence: Not on file    No family history on file.  No Known Allergies  Review of Systems  Constitutional: Negative.   HENT: Negative.    Eyes: Negative.   Respiratory: Negative.    Cardiovascular: Negative.   Gastrointestinal: Negative.   Genitourinary: Negative.   Skin: Negative.   Neurological: Negative.   Endo/Heme/Allergies: Negative.        Objective:   BP (!) 112/90   Pulse 91   Temp 98.1 F (36.7 C) (Tympanic)   Ht 5\' 9"  (1.753 m)    Wt 230 lb 9.6 oz (104.6 kg)   SpO2 94%   BMI 34.05 kg/m   Vitals:   06/21/22 1012  BP: (!) 112/90  Pulse: 91  Temp: 98.1 F (36.7 C)  Height: 5\' 9"  (1.753 m)  Weight: 230 lb 9.6 oz (104.6 kg)  SpO2: 94%  TempSrc: Tympanic  BMI (Calculated): 34.04    Physical Exam Vitals reviewed.  Constitutional:      Appearance: Normal appearance. He is obese.  HENT:     Head: Normocephalic.     Left Ear: There is no impacted cerumen.     Nose: Nose normal.     Mouth/Throat:     Mouth: Mucous membranes are moist.     Pharynx: No posterior oropharyngeal erythema.  Eyes:     Extraocular Movements: Extraocular movements intact.     Pupils: Pupils are equal, round, and reactive to light.  Cardiovascular:     Rate and Rhythm: Regular rhythm.     Chest Wall: PMI is not displaced.     Pulses: Normal pulses.     Heart sounds: Normal heart sounds. No murmur heard. Pulmonary:     Effort: Pulmonary effort is normal.     Breath sounds: Normal air entry. No rhonchi or rales.  Abdominal:     General: Abdomen is flat. Bowel sounds are normal. There is no distension.     Palpations: Abdomen is soft. There is no hepatomegaly, splenomegaly or mass.     Tenderness: There is no abdominal tenderness.  Musculoskeletal:        General: Normal range of motion.     Cervical back: Normal range of motion and neck supple.     Right lower leg: No edema.     Left lower leg: No edema.  Skin:    General: Skin is warm and dry.     Comments: Left ankle wound healed  Neurological:     General: No focal deficit present.     Mental Status: He is alert and oriented to person, place, and time.     Cranial Nerves: No cranial nerve deficit.     Motor: No weakness.  Psychiatric:        Mood and Affect: Mood normal.        Behavior: Behavior normal.      Results for orders placed or performed in visit on 06/21/22  POCT Glucose (CBG)  Result Value Ref Range   POC Glucose 265 (A) 70 - 99 mg/dl    Recent  Results (from the past 2160 hour(Gannon Heinzman))  POCT Glucose (CBG)     Status: Abnormal   Collection Time: 06/02/22 11:53 AM  Result Value Ref Range   POC Glucose 169 (A) 70 - 99 mg/dl  POCT Glucose (CBG)     Status: Abnormal   Collection Time: 06/21/22 10:19 AM  Result Value Ref Range   POC Glucose 265 (A) 70 - 99 mg/dl      Assessment & Plan:   Problem List Items Addressed This Visit   None Visit Diagnoses     Type 2 diabetes mellitus without complication, with long-term current use of insulin (HCC)    -  Primary   Relevant Orders   POCT Glucose (CBG) (Completed)     Will call pt with results  No follow-ups on file.   Total time spent: 20 minutes  Volanda Napoleon, MD  06/21/2022

## 2022-07-05 ENCOUNTER — Other Ambulatory Visit: Payer: Self-pay | Admitting: Internal Medicine

## 2022-07-05 DIAGNOSIS — I10 Essential (primary) hypertension: Secondary | ICD-10-CM

## 2022-07-06 ENCOUNTER — Other Ambulatory Visit: Payer: Self-pay | Admitting: Internal Medicine

## 2022-07-06 DIAGNOSIS — I10 Essential (primary) hypertension: Secondary | ICD-10-CM

## 2022-07-20 ENCOUNTER — Other Ambulatory Visit: Payer: Self-pay | Admitting: Internal Medicine

## 2022-07-20 DIAGNOSIS — G8929 Other chronic pain: Secondary | ICD-10-CM

## 2022-07-20 DIAGNOSIS — Z794 Long term (current) use of insulin: Secondary | ICD-10-CM

## 2022-07-22 ENCOUNTER — Telehealth: Payer: Self-pay | Admitting: Internal Medicine

## 2022-07-22 NOTE — Telephone Encounter (Signed)
Patient called because the pharmacy texted him regarding his prescriptions. He says he IS taking the tramadol and Ozempic. He said that you prescribe both of these for him and that he did not tell anyone that he is not taking tramadol. Patient got very upset about you not filling his tramadol. According to his 3/6 visit you did prescribe his Ozempic, I am unsure why it is not on his med list. I also transferred him to front desk to schedule a follow up appt with you. Please send in these two medications for him.

## 2022-07-23 ENCOUNTER — Encounter: Payer: Self-pay | Admitting: Internal Medicine

## 2022-07-23 ENCOUNTER — Ambulatory Visit (INDEPENDENT_AMBULATORY_CARE_PROVIDER_SITE_OTHER): Payer: Medicare PPO | Admitting: Internal Medicine

## 2022-07-23 VITALS — BP 130/90 | HR 92 | Ht 69.0 in | Wt 231.0 lb

## 2022-07-23 DIAGNOSIS — Z794 Long term (current) use of insulin: Secondary | ICD-10-CM

## 2022-07-23 DIAGNOSIS — E782 Mixed hyperlipidemia: Secondary | ICD-10-CM

## 2022-07-23 DIAGNOSIS — G8929 Other chronic pain: Secondary | ICD-10-CM

## 2022-07-23 DIAGNOSIS — E1165 Type 2 diabetes mellitus with hyperglycemia: Secondary | ICD-10-CM | POA: Diagnosis not present

## 2022-07-23 DIAGNOSIS — I10 Essential (primary) hypertension: Secondary | ICD-10-CM | POA: Diagnosis not present

## 2022-07-23 MED ORDER — OZEMPIC (1 MG/DOSE) 4 MG/3ML ~~LOC~~ SOPN: 1.0000 mg | PEN_INJECTOR | SUBCUTANEOUS | 2 refills | Status: DC

## 2022-07-23 MED ORDER — TRAMADOL HCL 50 MG PO TABS: 50.0000 mg | ORAL_TABLET | Freq: Three times a day (TID) | ORAL | 0 refills | Status: DC

## 2022-07-23 NOTE — Progress Notes (Signed)
Established Patient Office Visit  Subjective:  Patient ID: [name], male    DOB: Feb 22, 1976  Age: 47 y.o. MRN: [id number]  Chief Complaint  Patient presents with   Follow-up    Check up and med refills    No new complaints, here for lab review and medication refills.     No other concerns at this time.   Past Medical History:  Diagnosis Date   Diabetes mellitus without complication (HCC)    Hypertension     No past surgical history on file.  Social History   Socioeconomic History   Marital status: Single    Spouse name: Not on file   Number of children: Not on file   Years of education: Not on file   Highest education level: Not on file  Occupational History   Not on file  Tobacco Use   Smoking status: Every Day   Smokeless tobacco: Never  Substance and Sexual Activity   Alcohol use: Yes    Alcohol/week: 0.0 standard drinks of alcohol   Drug use: No   Sexual activity: Not on file  Other Topics Concern   Not on file  Social History Narrative   Not on file   Social Determinants of Health   Financial Resource Strain: Not on file  Food Insecurity: Not on file  Transportation Needs: Not on file  Physical Activity: Not on file  Stress: Not on file  Social Connections: Not on file  Intimate Partner Violence: Not on file    No family history on file.  No Known Allergies  Review of Systems  Constitutional: Negative.   HENT: Negative.    Eyes: Negative.   Respiratory: Negative.    Cardiovascular: Negative.   Gastrointestinal: Negative.   Genitourinary: Negative.   Skin: Negative.   Neurological: Negative.   Endo/Heme/Allergies: Negative.        Objective:   BP (!) 130/90   Pulse 92   Ht 5\' 9"  (1.753 m)   Wt 231 lb (104.8 kg)   SpO2 96%   BMI 34.11 kg/m   Vitals:   07/23/22 1014  BP: (!) 130/90  Pulse: 92  Height: 5\' 9"  (1.753 m)  Weight: 231 lb (104.8 kg)  SpO2: 96%  BMI (Calculated): 34.1    Physical Exam Vitals  reviewed.  Constitutional:      Appearance: Normal appearance. He is obese.  HENT:     Head: Normocephalic.     Left Ear: There is no impacted cerumen.     Nose: Nose normal.     Mouth/Throat:     Mouth: Mucous membranes are moist.     Pharynx: No posterior oropharyngeal erythema.  Eyes:     Extraocular Movements: Extraocular movements intact.     Pupils: Pupils are equal, round, and reactive to light.  Cardiovascular:     Rate and Rhythm: Regular rhythm.     Chest Wall: PMI is not displaced.     Pulses: Normal pulses.     Heart sounds: Normal heart sounds. No murmur heard. Pulmonary:     Effort: Pulmonary effort is normal.     Breath sounds: Normal air entry. No rhonchi or rales.  Abdominal:     General: Abdomen is flat. Bowel sounds are normal. There is no distension.     Palpations: Abdomen is soft. There is no hepatomegaly, splenomegaly or mass.     Tenderness: There is no abdominal tenderness.  Musculoskeletal:        General:  Normal range of motion.     Cervical back: Normal range of motion and neck supple.     Right lower leg: No edema.     Left lower leg: No edema.  Skin:    General: Skin is warm and dry.     Comments: Left ankle wound healed  Neurological:     General: No focal deficit present.     Mental Status: He is alert and oriented to person, place, and time.     Cranial Nerves: No cranial nerve deficit.     Motor: No weakness.  Psychiatric:        Mood and Affect: Mood normal.        Behavior: Behavior normal.      No results found for any visits on 07/23/22.  Recent Results (from the past 2160 hour(Gerrianne Aydelott))  POCT Glucose (CBG)     Status: Abnormal   Collection Time: 06/02/22 11:53 AM  Result Value Ref Range   POC Glucose 169 (A) 70 - 99 mg/dl  POCT Glucose (CBG)     Status: Abnormal   Collection Time: 06/21/22 10:19 AM  Result Value Ref Range   POC Glucose 265 (A) 70 - 99 mg/dl      Assessment & Plan:   Problem List Items Addressed This Visit        Cardiovascular and Mediastinum   Primary hypertension - Primary   Relevant Medications   atorvastatin (LIPITOR) 80 MG tablet   Other Visit Diagnoses     Other chronic pain       Relevant Medications   traMADol (ULTRAM) 50 MG tablet   traMADol (ULTRAM) 50 MG tablet   Uncontrolled type 2 diabetes mellitus with hyperglycemia, with long-term current use of insulin (HCC)       Relevant Medications   atorvastatin (LIPITOR) 80 MG tablet   OZEMPIC, 1 MG/DOSE, 4 MG/3ML SOPN   Other Relevant Orders   Hemoglobin A1c   Mixed hyperlipidemia       Relevant Medications   atorvastatin (LIPITOR) 80 MG tablet   Other Relevant Orders   CK   Lipid panel   Hepatic function panel       Return in about 2 weeks (around 08/06/2022) for fu with labs prior.   Total time spent: 20 minutes  Luna Fuse, MD  07/23/2022

## 2022-08-03 ENCOUNTER — Other Ambulatory Visit: Payer: Self-pay | Admitting: Internal Medicine

## 2022-08-03 DIAGNOSIS — E1165 Type 2 diabetes mellitus with hyperglycemia: Secondary | ICD-10-CM

## 2022-08-27 ENCOUNTER — Ambulatory Visit: Payer: Medicare PPO | Admitting: Internal Medicine

## 2022-08-30 ENCOUNTER — Other Ambulatory Visit: Payer: Self-pay | Admitting: Internal Medicine

## 2022-08-30 DIAGNOSIS — G8929 Other chronic pain: Secondary | ICD-10-CM

## 2022-09-17 ENCOUNTER — Ambulatory Visit: Payer: Medicare PPO | Admitting: Internal Medicine

## 2022-10-08 ENCOUNTER — Ambulatory Visit (INDEPENDENT_AMBULATORY_CARE_PROVIDER_SITE_OTHER): Payer: Medicare PPO | Admitting: Internal Medicine

## 2022-10-08 VITALS — BP 170/98 | HR 91 | Ht 69.0 in | Wt 231.6 lb

## 2022-10-08 DIAGNOSIS — E1165 Type 2 diabetes mellitus with hyperglycemia: Secondary | ICD-10-CM

## 2022-10-08 DIAGNOSIS — Z794 Long term (current) use of insulin: Secondary | ICD-10-CM | POA: Diagnosis not present

## 2022-10-08 DIAGNOSIS — I10 Essential (primary) hypertension: Secondary | ICD-10-CM

## 2022-10-08 DIAGNOSIS — G8929 Other chronic pain: Secondary | ICD-10-CM | POA: Insufficient documentation

## 2022-10-08 DIAGNOSIS — E1121 Type 2 diabetes mellitus with diabetic nephropathy: Secondary | ICD-10-CM | POA: Diagnosis not present

## 2022-10-08 DIAGNOSIS — E119 Type 2 diabetes mellitus without complications: Secondary | ICD-10-CM

## 2022-10-08 LAB — POCT CBG (FASTING - GLUCOSE)-MANUAL ENTRY: Glucose Fasting, POC: 138 mg/dL — AB (ref 70–99)

## 2022-10-08 LAB — POC CREATINE & ALBUMIN,URINE
Creatinine, POC: 300 mg/dL
Microalbumin Ur, POC: 150 mg/L

## 2022-10-08 MED ORDER — OLMESARTAN MEDOXOMIL 40 MG PO TABS
40.0000 mg | ORAL_TABLET | Freq: Every day | ORAL | 0 refills | Status: DC
Start: 2022-10-08 — End: 2023-05-17

## 2022-10-08 MED ORDER — TRAMADOL HCL 50 MG PO TABS
50.0000 mg | ORAL_TABLET | Freq: Three times a day (TID) | ORAL | 0 refills | Status: DC
Start: 2022-10-08 — End: 2022-11-24

## 2022-10-08 MED ORDER — KERENDIA 20 MG PO TABS
20.0000 mg | ORAL_TABLET | Freq: Every day | ORAL | 1 refills | Status: DC
Start: 2022-10-08 — End: 2023-08-24

## 2022-10-08 MED ORDER — OZEMPIC (1 MG/DOSE) 4 MG/3ML ~~LOC~~ SOPN
1.0000 mg | PEN_INJECTOR | SUBCUTANEOUS | 2 refills | Status: DC
Start: 2022-10-08 — End: 2023-01-31

## 2022-10-08 NOTE — Progress Notes (Signed)
Spoke with patient DOB was verified and was informed of MS suggestions and Rx order; Patient verbalized understanding.

## 2022-10-08 NOTE — Progress Notes (Signed)
Established Patient Office Visit  Subjective:  Patient ID: [name], male    DOB: [birth year]  Age: 47 y.o. MRN: [id number]  Chief Complaint  Patient presents with   Follow-up    2 week F/U    No new complaints, here for lab review and medication refills. Failed to have previsit labs done.     No other concerns at this time.   Past Medical History:  Diagnosis Date   Diabetes mellitus without complication (HCC)    Hypertension     No past surgical history on file.  Social History   Socioeconomic History   Marital status: Single    Spouse name: Not on file   Number of children: Not on file   Years of education: Not on file   Highest education level: Not on file  Occupational History   Not on file  Tobacco Use   Smoking status: Every Day   Smokeless tobacco: Never  Substance and Sexual Activity   Alcohol use: Yes    Alcohol/week: 0.0 standard drinks of alcohol   Drug use: No   Sexual activity: Not on file  Other Topics Concern   Not on file  Social History Narrative   Not on file   Social Determinants of Health   Financial Resource Strain: Not on file  Food Insecurity: No Food Insecurity (06/22/2019)   Received from [facility]   Hunger Vital Sign    Worried About Running Out of Food in the Last Year: Never true    Ran Out of Food in the Last Year: Never true  Transportation Needs: Not on file  Physical Activity: Not on file  Stress: Not on file  Social Connections: Not on file  Intimate Partner Violence: Not on file    No family history on file.  No Known Allergies  Review of Systems  Constitutional: Negative.   HENT: Negative.    Eyes: Negative.   Respiratory: Negative.    Cardiovascular: Negative.   Gastrointestinal: Negative.   Genitourinary: Negative.   Skin: Negative.   Neurological: Negative.   Endo/Heme/Allergies: Negative.        Objective:   BP (!) 170/98   Pulse 91   Ht 5\' 9"  (1.753 m)   Wt  231 lb 9.6 oz (105.1 kg)   SpO2 96%   BMI 34.20 kg/m   Vitals:   10/08/22 1124  BP: (!) 170/98  Pulse: 91  Height: 5\' 9"  (1.753 m)  Weight: 231 lb 9.6 oz (105.1 kg)  SpO2: 96%  BMI (Calculated): 34.19    Physical Exam Vitals reviewed.  Constitutional:      Appearance: Normal appearance. He is obese.  HENT:     Head: Normocephalic.     Left Ear: There is no impacted cerumen.     Nose: Nose normal.     Mouth/Throat:     Mouth: Mucous membranes are moist.     Pharynx: No posterior oropharyngeal erythema.  Eyes:     Extraocular Movements: Extraocular movements intact.     Pupils: Pupils are equal, round, and reactive to light.  Cardiovascular:     Rate and Rhythm: Regular rhythm.     Chest Wall: PMI is not displaced.     Pulses: Normal pulses.     Heart sounds: Normal heart sounds. No murmur heard. Pulmonary:     Effort: Pulmonary effort is normal.     Breath sounds: Normal air entry. No rhonchi or rales.  Abdominal:     General: Abdomen is flat. Bowel sounds are normal. There is no distension.     Palpations: Abdomen is soft. There is no hepatomegaly, splenomegaly or mass.     Tenderness: There is no abdominal tenderness.  Musculoskeletal:        General: Normal range of motion.     Cervical back: Normal range of motion and neck supple.     Right lower leg: No edema.     Left lower leg: No edema.  Skin:    General: Skin is warm and dry.     Comments: Left ankle wound healed  Neurological:     General: No focal deficit present.     Mental Status: He is alert and oriented to person, place, and time.     Cranial Nerves: No cranial nerve deficit.     Motor: No weakness.  Psychiatric:        Mood and Affect: Mood normal.        Behavior: Behavior normal.      Results for orders placed or performed in visit on 10/08/22  POCT CBG (Fasting - Glucose)  Result Value Ref Range   Glucose Fasting, POC 138 (A) 70 - 99 mg/dL    Recent Results (from the past 2160  hour(Ludwika Rodd))  POCT CBG (Fasting - Glucose)     Status: Abnormal   Collection Time: 10/08/22 11:30 AM  Result Value Ref Range   Glucose Fasting, POC 138 (A) 70 - 99 mg/dL      Assessment & Plan:  As per problem list. Stricter low calorie diet, low cholesterol and low fat diet and exercise as much as possible.  Problem List Items Addressed This Visit       Cardiovascular and Mediastinum   Essential (primary) hypertension   Relevant Medications   olmesartan (BENICAR) 40 MG tablet     Endocrine   Uncontrolled type 2 diabetes mellitus with hyperglycemia, with long-term current use of insulin (HCC) - Primary   Relevant Medications   OZEMPIC, 1 MG/DOSE, 4 MG/3ML SOPN   olmesartan (BENICAR) 40 MG tablet   Other Relevant Orders   POCT CBG (Fasting - Glucose) (Completed)     Other   Other chronic pain   Relevant Medications   traMADol (ULTRAM) 50 MG tablet   Other Visit Diagnoses     Type 2 diabetes mellitus without complication, with long-term current use of insulin (HCC)       Relevant Medications   OZEMPIC, 1 MG/DOSE, 4 MG/3ML SOPN   olmesartan (BENICAR) 40 MG tablet   Other Relevant Orders   Fructosamine       No follow-ups on file.   Total time spent: 20 minutes  Luna Fuse, MD  10/08/2022   This document may have been prepared by [facility] Voice Recognition software and as such may include unintentional dictation errors.

## 2022-11-19 ENCOUNTER — Ambulatory Visit: Payer: Medicare PPO | Admitting: Internal Medicine

## 2022-11-23 ENCOUNTER — Other Ambulatory Visit: Payer: Self-pay | Admitting: Internal Medicine

## 2022-11-23 DIAGNOSIS — G8929 Other chronic pain: Secondary | ICD-10-CM

## 2022-12-03 ENCOUNTER — Ambulatory Visit: Payer: Medicare PPO | Admitting: Internal Medicine

## 2022-12-03 ENCOUNTER — Other Ambulatory Visit: Payer: Self-pay

## 2022-12-03 DIAGNOSIS — E1121 Type 2 diabetes mellitus with diabetic nephropathy: Secondary | ICD-10-CM

## 2022-12-03 DIAGNOSIS — E119 Type 2 diabetes mellitus without complications: Secondary | ICD-10-CM

## 2022-12-24 ENCOUNTER — Ambulatory Visit: Payer: Medicare PPO | Admitting: Internal Medicine

## 2023-01-11 ENCOUNTER — Telehealth: Payer: Self-pay

## 2023-01-11 DIAGNOSIS — E119 Type 2 diabetes mellitus without complications: Secondary | ICD-10-CM

## 2023-01-12 ENCOUNTER — Other Ambulatory Visit: Payer: Self-pay

## 2023-01-12 DIAGNOSIS — E1165 Type 2 diabetes mellitus with hyperglycemia: Secondary | ICD-10-CM

## 2023-01-12 DIAGNOSIS — E782 Mixed hyperlipidemia: Secondary | ICD-10-CM

## 2023-01-12 DIAGNOSIS — E119 Type 2 diabetes mellitus without complications: Secondary | ICD-10-CM

## 2023-01-12 NOTE — Addendum Note (Signed)
Addended by: Matthew Saras on: 01/12/2023 01:10 PM   Modules accepted: Orders

## 2023-01-14 ENCOUNTER — Other Ambulatory Visit: Payer: Self-pay | Admitting: Internal Medicine

## 2023-01-14 ENCOUNTER — Ambulatory Visit: Payer: Medicare PPO | Admitting: Internal Medicine

## 2023-01-14 DIAGNOSIS — G8929 Other chronic pain: Secondary | ICD-10-CM

## 2023-01-14 MED ORDER — TRAMADOL HCL 50 MG PO TABS: 50.0000 mg | ORAL_TABLET | Freq: Three times a day (TID) | ORAL | 0 refills | Status: DC

## 2023-01-27 ENCOUNTER — Other Ambulatory Visit: Payer: Self-pay

## 2023-01-28 ENCOUNTER — Other Ambulatory Visit: Payer: Self-pay

## 2023-01-28 ENCOUNTER — Ambulatory Visit: Payer: Medicare PPO | Admitting: Internal Medicine

## 2023-01-28 DIAGNOSIS — E782 Mixed hyperlipidemia: Secondary | ICD-10-CM

## 2023-01-28 DIAGNOSIS — E1165 Type 2 diabetes mellitus with hyperglycemia: Secondary | ICD-10-CM

## 2023-01-29 ENCOUNTER — Other Ambulatory Visit: Payer: Self-pay | Admitting: Internal Medicine

## 2023-01-29 DIAGNOSIS — E1165 Type 2 diabetes mellitus with hyperglycemia: Secondary | ICD-10-CM

## 2023-02-21 ENCOUNTER — Other Ambulatory Visit: Payer: Self-pay | Admitting: Internal Medicine

## 2023-02-21 DIAGNOSIS — G8929 Other chronic pain: Secondary | ICD-10-CM

## 2023-02-21 DIAGNOSIS — E119 Type 2 diabetes mellitus without complications: Secondary | ICD-10-CM

## 2023-02-22 ENCOUNTER — Other Ambulatory Visit: Payer: Self-pay

## 2023-02-23 ENCOUNTER — Other Ambulatory Visit: Payer: Self-pay

## 2023-03-08 ENCOUNTER — Other Ambulatory Visit: Payer: Self-pay | Admitting: Internal Medicine

## 2023-03-08 DIAGNOSIS — E1165 Type 2 diabetes mellitus with hyperglycemia: Secondary | ICD-10-CM

## 2023-03-08 MED ORDER — INSULIN DEGLUDEC 100 UNIT/ML ~~LOC~~ SOLN: 72.0000 [IU] | Freq: Every day | SUBCUTANEOUS | 2 refills | Status: DC

## 2023-04-04 ENCOUNTER — Other Ambulatory Visit: Payer: Self-pay | Admitting: Internal Medicine

## 2023-04-04 DIAGNOSIS — G8929 Other chronic pain: Secondary | ICD-10-CM

## 2023-05-09 ENCOUNTER — Other Ambulatory Visit: Payer: Self-pay | Admitting: Internal Medicine

## 2023-05-09 DIAGNOSIS — G8929 Other chronic pain: Secondary | ICD-10-CM

## 2023-05-13 ENCOUNTER — Telehealth: Payer: Self-pay | Admitting: Internal Medicine

## 2023-05-13 NOTE — Telephone Encounter (Signed)
Patient left VM wanting to know why his tramadol Rx was denied and if you can send it in. Please advise.

## 2023-05-17 ENCOUNTER — Ambulatory Visit (INDEPENDENT_AMBULATORY_CARE_PROVIDER_SITE_OTHER): Payer: Medicare PPO | Admitting: Internal Medicine

## 2023-05-17 VITALS — BP 150/110 | HR 91 | Temp 98.5°F | Ht 69.0 in | Wt 240.0 lb

## 2023-05-17 DIAGNOSIS — Z794 Long term (current) use of insulin: Secondary | ICD-10-CM

## 2023-05-17 DIAGNOSIS — E119 Type 2 diabetes mellitus without complications: Secondary | ICD-10-CM | POA: Diagnosis not present

## 2023-05-17 DIAGNOSIS — G8929 Other chronic pain: Secondary | ICD-10-CM | POA: Diagnosis not present

## 2023-05-17 DIAGNOSIS — Z23 Encounter for immunization: Secondary | ICD-10-CM

## 2023-05-17 DIAGNOSIS — E782 Mixed hyperlipidemia: Secondary | ICD-10-CM

## 2023-05-17 DIAGNOSIS — I10 Essential (primary) hypertension: Secondary | ICD-10-CM

## 2023-05-17 LAB — POCT CBG (FASTING - GLUCOSE)-MANUAL ENTRY: Glucose Fasting, POC: 198 mg/dL — AB (ref 70–99)

## 2023-05-17 MED ORDER — OLMESARTAN MEDOXOMIL 40 MG PO TABS
40.0000 mg | ORAL_TABLET | Freq: Every day | ORAL | 0 refills | Status: DC
Start: 2023-05-17 — End: 2023-08-24

## 2023-05-17 MED ORDER — TRAMADOL HCL 50 MG PO TABS
50.0000 mg | ORAL_TABLET | Freq: Three times a day (TID) | ORAL | 0 refills | Status: DC
Start: 2023-05-17 — End: 2023-07-07

## 2023-05-17 NOTE — Progress Notes (Signed)
Established Patient Office Visit  Subjective:  Patient ID: [name], male    DOB: 15-Aug-1975  Age: 48 y.o. MRN: [id number]  Chief Complaint  Patient presents with   Medication Refill    Medication Refill    No new complaints, here for lab review and medication refills. BP elevated but hasn't taken his meds this am. Failed to have previsit labs done.     No other concerns at this time.   Past Medical History:  Diagnosis Date   Diabetes mellitus without complication (HCC)    Hypertension     No past surgical history on file.  Social History   Socioeconomic History   Marital status: Single    Spouse name: Not on file   Number of children: Not on file   Years of education: Not on file   Highest education level: Not on file  Occupational History   Not on file  Tobacco Use   Smoking status: Every Day   Smokeless tobacco: Never  Substance and Sexual Activity   Alcohol use: Yes    Alcohol/week: 0.0 standard drinks of alcohol   Drug use: No   Sexual activity: Not on file  Other Topics Concern   Not on file  Social History Narrative   Not on file   Social Drivers of Health   Financial Resource Strain: Not on file  Food Insecurity: No Food Insecurity (06/22/2019)   Received from [facility], [facility]   Hunger Vital Sign    Worried About Running Out of Food in the Last Year: Never true    Ran Out of Food in the Last Year: Never true  Transportation Needs: Not on file  Physical Activity: Not on file  Stress: Not on file  Social Connections: Not on file  Intimate Partner Violence: Not on file    No family history on file.  No Known Allergies  Outpatient Medications Prior to Visit  Medication Sig   aspirin 81 MG tablet Take 81 mg by mouth daily.   atorvastatin (LIPITOR) 80 MG tablet Take 1 tablet by mouth daily.   chlorthalidone (HYGROTON) 25 MG tablet TAKE 1 TABLET BY MOUTH EVERY DAY IN THE MORNING   ezetimibe (ZETIA) 10 MG tablet Take  10 mg by mouth daily.   FARXIGA 10 MG TABS tablet Take 10 mg by mouth every morning.   Insulin Degludec (TRESIBA) 100 UNIT/ML SOLN Inject 72 Units into the skin daily in the afternoon.   pregabalin (LYRICA) 150 MG capsule Take 150 mg by mouth 2 (two) times daily.   traMADol (ULTRAM) 50 MG tablet Take 50 mg by mouth every 6 (six) hours as needed.   [DISCONTINUED] olmesartan (BENICAR) 40 MG tablet Take 1 tablet (40 mg total) by mouth daily.   [DISCONTINUED] traMADol (ULTRAM) 50 MG tablet TAKE 1 TABLET BY MOUTH THREE TIMES A DAY   No facility-administered medications prior to visit.    Review of Systems  Constitutional: Negative.   HENT: Negative.    Eyes: Negative.   Respiratory: Negative.    Cardiovascular: Negative.   Gastrointestinal: Negative.   Genitourinary: Negative.   Skin: Negative.   Neurological: Negative.   Endo/Heme/Allergies: Negative.        Objective:   BP (!) 150/110   Pulse 91   Temp 98.5 F (36.9 C) (Tympanic)   Ht 5\' 9"  (1.753 m)   Wt 240 lb (108.9 kg)   SpO2 97%   BMI 35.44 kg/m   Vitals:  05/17/23 0941  BP: (!) 150/110  Pulse: 91  Temp: 98.5 F (36.9 C)  Height: 5\' 9"  (1.753 m)  Weight: 240 lb (108.9 kg)  SpO2: 97%  TempSrc: Tympanic  BMI (Calculated): 35.43    Physical Exam Vitals reviewed.  Constitutional:      Appearance: Normal appearance. He is obese.  HENT:     Head: Normocephalic.     Left Ear: There is no impacted cerumen.     Nose: Nose normal.     Mouth/Throat:     Mouth: Mucous membranes are moist.     Pharynx: No posterior oropharyngeal erythema.  Eyes:     Extraocular Movements: Extraocular movements intact.     Pupils: Pupils are equal, round, and reactive to light.  Cardiovascular:     Rate and Rhythm: Regular rhythm.     Chest Wall: PMI is not displaced.     Pulses: Normal pulses.     Heart sounds: Normal heart sounds. No murmur heard. Pulmonary:     Effort: Pulmonary effort is normal.     Breath sounds:  Normal air entry. No rhonchi or rales.  Abdominal:     General: Abdomen is flat. Bowel sounds are normal. There is no distension.     Palpations: Abdomen is soft. There is no hepatomegaly, splenomegaly or mass.     Tenderness: There is no abdominal tenderness.  Musculoskeletal:        General: Normal range of motion.     Cervical back: Normal range of motion and neck supple.     Right lower leg: No edema.     Left lower leg: No edema.  Skin:    General: Skin is warm and dry.     Comments: Left ankle wound healed  Neurological:     General: No focal deficit present.     Mental Status: He is alert and oriented to person, place, and time.     Cranial Nerves: No cranial nerve deficit.     Motor: No weakness.  Psychiatric:        Mood and Affect: Mood normal.        Behavior: Behavior normal.      Results for orders placed or performed in visit on 05/17/23  POCT CBG (Fasting - Glucose)  Result Value Ref Range   Glucose Fasting, POC 198 (A) 70 - 99 mg/dL    Recent Results (from the past 2160 hours)  POCT CBG (Fasting - Glucose)     Status: Abnormal   Collection Time: 05/17/23  9:47 AM  Result Value Ref Range   Glucose Fasting, POC 198 (A) 70 - 99 mg/dL      Assessment & Plan:  As per problem list. Problem List Items Addressed This Visit       Cardiovascular and Mediastinum   Essential (primary) hypertension   Relevant Medications   olmesartan (BENICAR) 40 MG tablet     Other   Other chronic pain   Relevant Medications   traMADol (ULTRAM) 50 MG tablet   Other Visit Diagnoses       Type 2 diabetes mellitus without complication, with long-term current use of insulin (HCC)    -  Primary   Relevant Medications   olmesartan (BENICAR) 40 MG tablet   Other Relevant Orders   POCT CBG (Fasting - Glucose) (Completed)   Hemoglobin A1c     Mixed hyperlipidemia       Relevant Medications   olmesartan (BENICAR) 40 MG tablet   Other  Relevant Orders   Lipid panel    Comprehensive metabolic panel       Return in about 2 weeks (around 05/31/2023) for fu with labs prior.   Total time spent: 20 minutes  Luna Fuse, MD  05/17/2023   This document may have been prepared by [facility] Voice Recognition software and as such may include unintentional dictation errors.

## 2023-05-17 NOTE — Addendum Note (Signed)
Addended by: Hassell Halim on: 05/17/2023 11:34 AM   Modules accepted: Orders

## 2023-05-29 IMAGING — CT CT NECK W/ CM
5 of 6 series · 13 of 33 positions shown, 15 images · IV contrast (APPLIED)
Comparison: None.

CLINICAL DATA: Initial evaluation for acute left-sided neck pain,
swelling.

EXAM:
CT NECK WITH CONTRAST
TECHNIQUE: Multidetector CT imaging of the neck was performed using the
standard protocol following the bolus administration of intravenous
contrast.
CONTRAST:  75mL OMNIPAQUE IOHEXOL 300 MG/ML  SOLN

[Series 3: axial neck · axial · 0.53mm/px · z∈[+1216,+1304]mm · 2 of 132 slices shown, 3 images]
[im 44/132  soft-tissue]
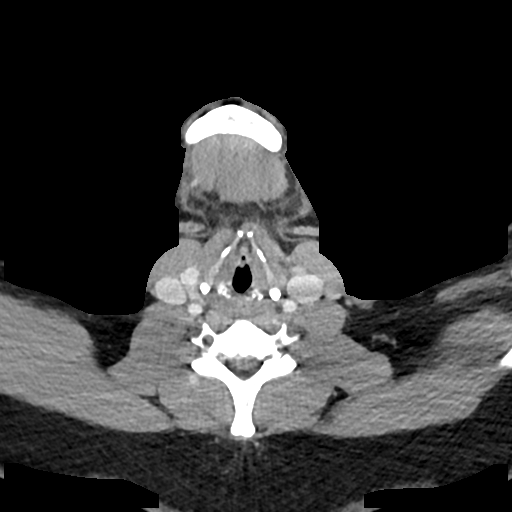
[im 44/132  bone]
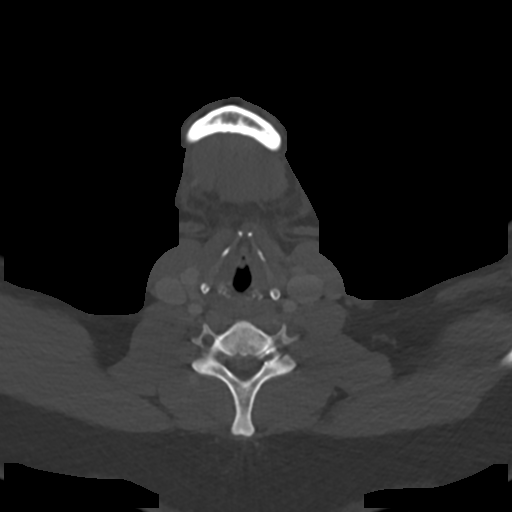
[im 88/132  bone]
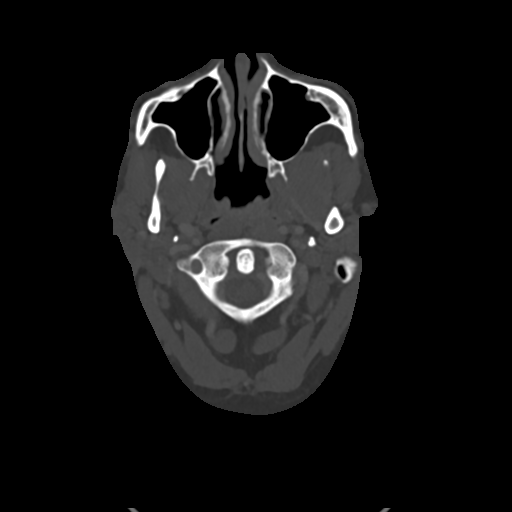

[Series 5: axial bone · axial · 0.53mm/px · z∈[+1216,+1304]mm · 2 of 132 slices shown]
[im 44/132  bone]
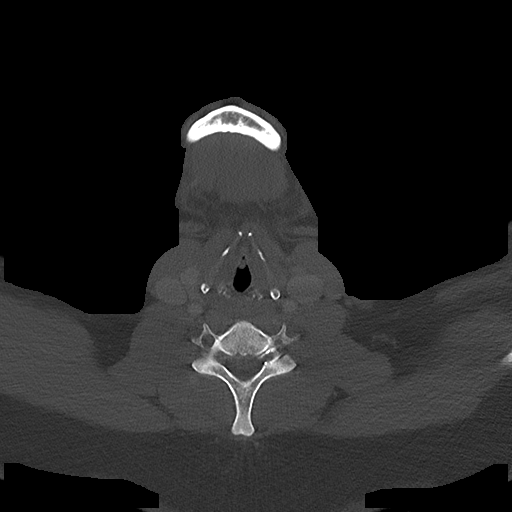
[im 88/132  bone]
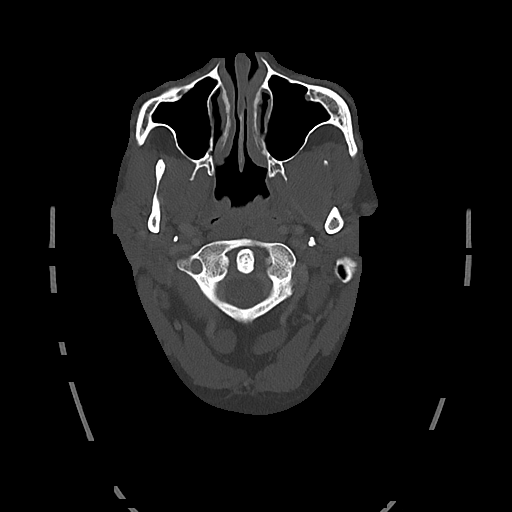

[Series 6: sag neck · sagittal · 0.51mm/px · 5 of 151 slices shown, 6 images]
[im 51/151  bone]
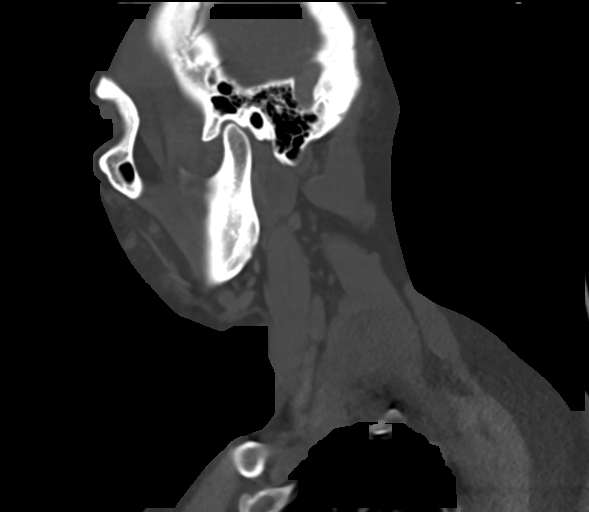
[im 63/151  bone]
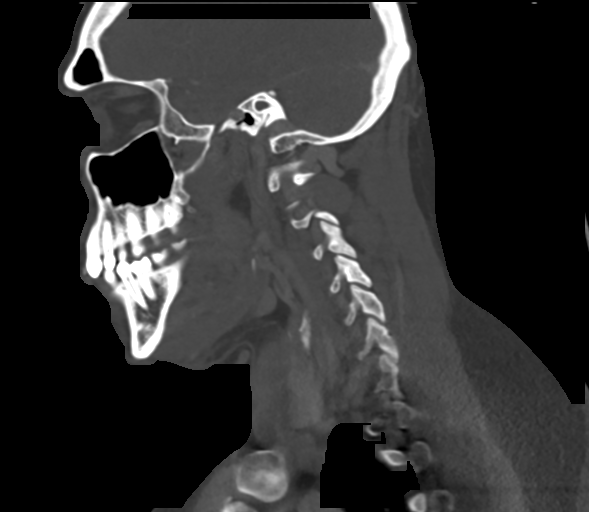
[im 76/151  soft-tissue]
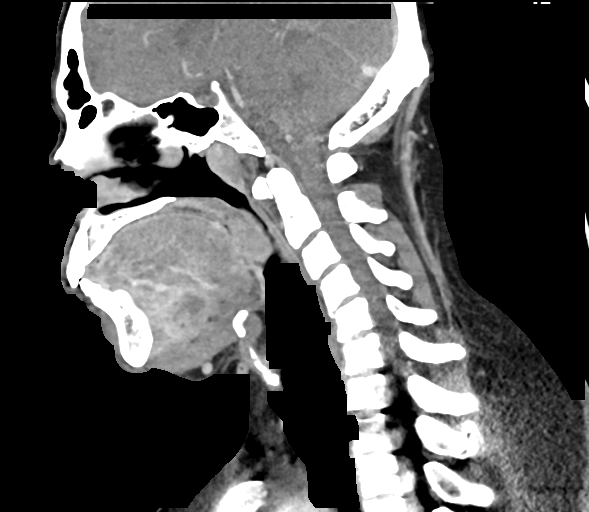
[im 76/151  bone]
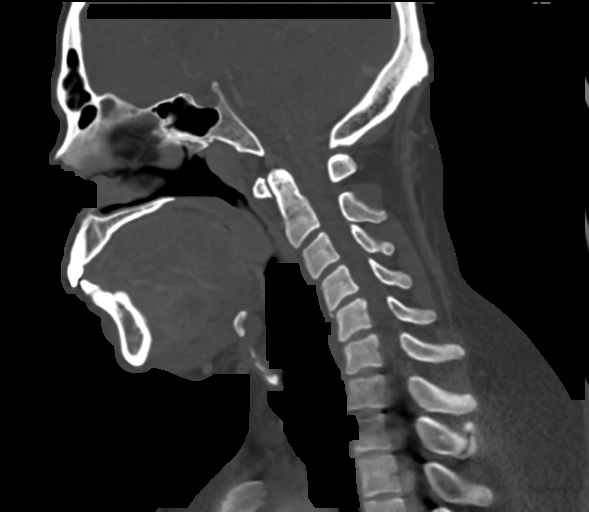
[im 88/151  bone]
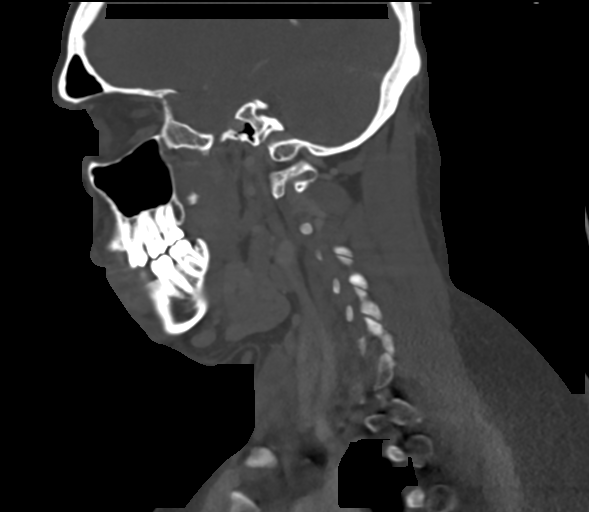
[im 101/151  bone]
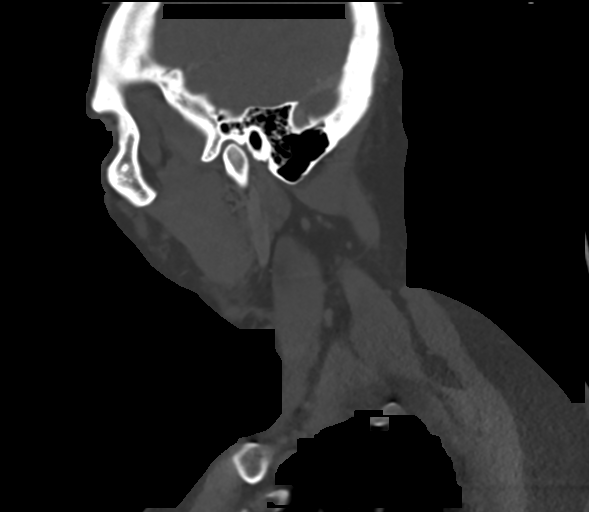

[Series 7: cor neck · coronal · 0.51mm/px · 3 of 141 slices shown]
[im 29/141  bone]
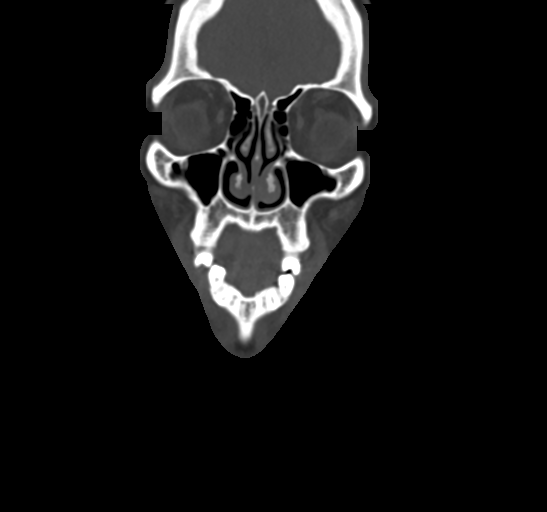
[im 57/141  bone]
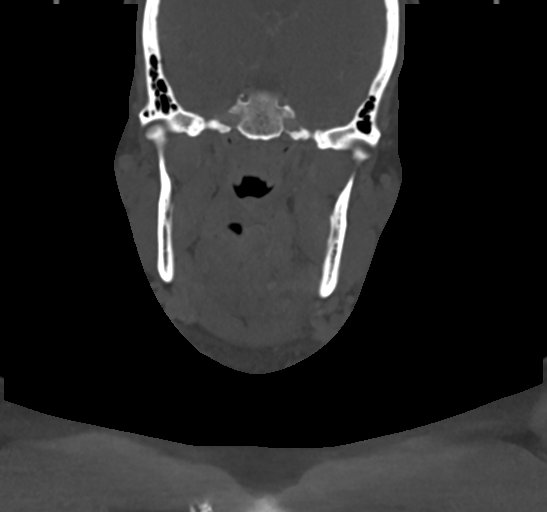
[im 85/141  bone]
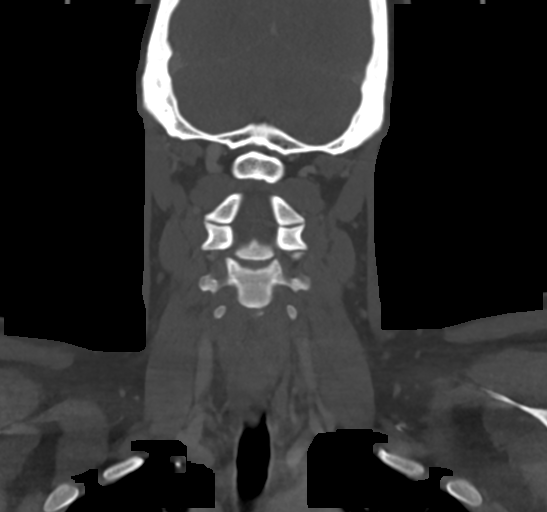

[Series 8: ax oropharynx · axial · 0.58mm/px · 1 of 131 slices shown]
[im 44/131  bone]
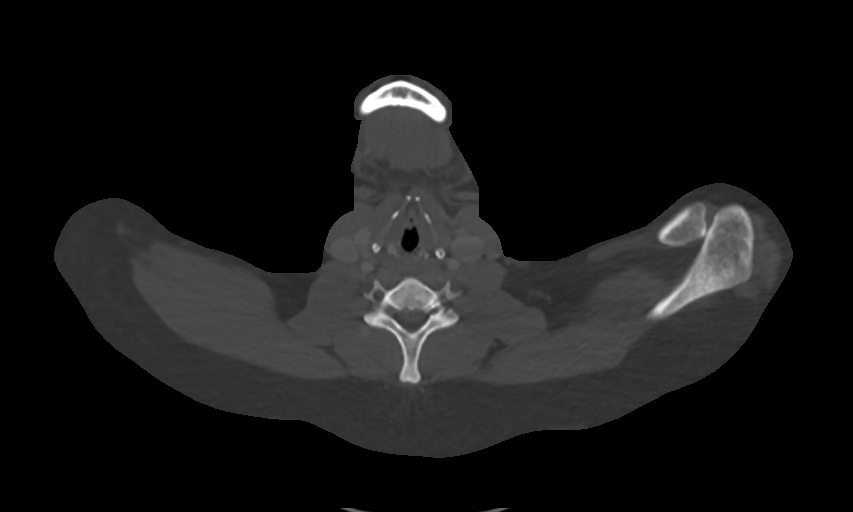

[13 of 33 positions shown; findings below may reference images not displayed]

FINDINGS: Pharynx and larynx: Palatine tonsils symmetric and within normal
limits. No discrete tonsillar or peritonsillar abscess. Remainder of
the oropharynx and nasopharynx within normal limits. No
retropharyngeal collection or swelling. Epiglottis normal. Remainder
of the hypopharynx and supraglottic larynx within normal limits.
There is a possible 3 mm polypoid projection extending from the
right true vocal cord near the anterior commissure (series 3, image
90 on axial sequence, series 7, image 73 on coronal sequence).
Glottis otherwise unremarkable. Subglottic airway patent and clear.

Salivary glands: Parotid glands within normal limits. Right
submandibular gland normal.

On the left, there is slight asymmetric enlargement with
heterogeneity of the left submandibular gland, suspicious for acute
sialoadenitis. The left submandibular duct is mildly dilated.
Serpiginous hypodensities at the level of the left floor of mouth
and sublingual space favored to reflect tortuosity of the dilated
left submandibular duct, although associated abscess formation is
difficult to exclude. Area of involvement measures approximately
cm in greatest dimension (series 7, image 49). The left sublingual
gland appears to be enlarged and inflamed as well. The oral tongue
appears somewhat lifted in the oral cavity. Associated swelling with
inflammatory stranding noted within the adjacent left greater than
right submandibular spaces and submental region, consistent with
regional cellulitis.

Thyroid: Normal.

Lymph nodes: Mildly prominent level I a and IB nodes noted, largest
of which seen at left level IB and measures 8 mm. Findings
presumably reactive. No other enlarged or pathologic adenopathy
within the neck.

Vascular: Normal intravascular enhancement seen throughout the neck.

Limited intracranial: Unremarkable.

Visualized orbits: Unremarkable.

Mastoids and visualized paranasal sinuses: Paranasal sinuses are
clear. Mastoid air cells and middle ear cavities are well
pneumatized and free of fluid.

Skeleton: No acute osseous abnormality. No discrete or worrisome
osseous lesions. Torus mandibularis noted.

Upper chest: Visualized upper chest demonstrates no acute finding.
Partially visualized lungs are largely clear.

Other: None.
IMPRESSION: 1. Findings consistent with acute left submandibular/sublingual
sialoadenitis with associated regional cellulitis within the
adjacent left greater than right submandibular spaces and submental
region. Mild dilatation of the left submandibular duct without
obstructive stone. Serpiginous hypodensities at the left sublingual
space favored to reflect ductal tortuosity, although associated
abscess formation is difficult to exclude. Additionally, there is
mild mass effect about the left sublingual space with lifting of the
oral tongue, suggesting that this patient could potentially be at
risk for airway compromise. Close clinical monitoring recommended.
2. Mildly prominent left-sided cervical adenopathy as above,
presumably reactive.
3. Question 3 mm polypoid projection extending from the right true
vocal cord near the anterior commissure, which could reflect a small
polyp or mass. ENT referral for direct visualization suggested.

## 2023-05-31 ENCOUNTER — Encounter: Payer: Medicare PPO | Admitting: Internal Medicine

## 2023-06-02 ENCOUNTER — Other Ambulatory Visit: Payer: Self-pay | Admitting: Internal Medicine

## 2023-06-02 DIAGNOSIS — E1165 Type 2 diabetes mellitus with hyperglycemia: Secondary | ICD-10-CM

## 2023-06-29 ENCOUNTER — Other Ambulatory Visit: Payer: Self-pay

## 2023-06-29 DIAGNOSIS — I10 Essential (primary) hypertension: Secondary | ICD-10-CM

## 2023-06-29 MED ORDER — CHLORTHALIDONE 25 MG PO TABS: 25.0000 mg | ORAL_TABLET | Freq: Every day | ORAL | 0 refills | Status: DC

## 2023-07-06 ENCOUNTER — Other Ambulatory Visit: Payer: Self-pay | Admitting: Internal Medicine

## 2023-07-06 DIAGNOSIS — G8929 Other chronic pain: Secondary | ICD-10-CM

## 2023-07-19 ENCOUNTER — Other Ambulatory Visit: Payer: Self-pay | Admitting: Internal Medicine

## 2023-07-19 DIAGNOSIS — G8929 Other chronic pain: Secondary | ICD-10-CM

## 2023-07-25 ENCOUNTER — Other Ambulatory Visit: Payer: Self-pay | Admitting: Internal Medicine

## 2023-07-25 DIAGNOSIS — G8929 Other chronic pain: Secondary | ICD-10-CM

## 2023-07-28 DIAGNOSIS — E119 Type 2 diabetes mellitus without complications: Secondary | ICD-10-CM | POA: Diagnosis not present

## 2023-07-28 DIAGNOSIS — E782 Mixed hyperlipidemia: Secondary | ICD-10-CM | POA: Diagnosis not present

## 2023-07-28 DIAGNOSIS — Z794 Long term (current) use of insulin: Secondary | ICD-10-CM | POA: Diagnosis not present

## 2023-07-29 ENCOUNTER — Other Ambulatory Visit: Payer: Self-pay | Admitting: Internal Medicine

## 2023-07-29 DIAGNOSIS — G8929 Other chronic pain: Secondary | ICD-10-CM

## 2023-08-01 ENCOUNTER — Other Ambulatory Visit: Payer: Self-pay | Admitting: Internal Medicine

## 2023-08-01 DIAGNOSIS — E1165 Type 2 diabetes mellitus with hyperglycemia: Secondary | ICD-10-CM

## 2023-08-02 ENCOUNTER — Telehealth: Payer: Self-pay | Admitting: Internal Medicine

## 2023-08-02 ENCOUNTER — Other Ambulatory Visit: Payer: Self-pay | Admitting: Internal Medicine

## 2023-08-02 DIAGNOSIS — G8929 Other chronic pain: Secondary | ICD-10-CM

## 2023-08-02 MED ORDER — TRAMADOL HCL 50 MG PO TABS: 50.0000 mg | ORAL_TABLET | Freq: Three times a day (TID) | ORAL | 0 refills | Status: AC

## 2023-08-02 NOTE — Telephone Encounter (Signed)
 Pt called stating that he ha been out of his pain meds for about a week now & needed a refill of TRAMADOL  50mg  sent to [facility] [address]

## 2023-08-24 ENCOUNTER — Ambulatory Visit: Admitting: Internal Medicine

## 2023-08-24 ENCOUNTER — Encounter: Payer: Self-pay | Admitting: Internal Medicine

## 2023-08-24 VITALS — BP 122/84 | HR 109 | Temp 97.5°F | Ht 69.0 in | Wt 239.4 lb

## 2023-08-24 DIAGNOSIS — G8929 Other chronic pain: Secondary | ICD-10-CM

## 2023-08-24 DIAGNOSIS — I10 Essential (primary) hypertension: Secondary | ICD-10-CM | POA: Diagnosis not present

## 2023-08-24 DIAGNOSIS — E119 Type 2 diabetes mellitus without complications: Secondary | ICD-10-CM

## 2023-08-24 DIAGNOSIS — E1165 Type 2 diabetes mellitus with hyperglycemia: Secondary | ICD-10-CM

## 2023-08-24 DIAGNOSIS — Z794 Long term (current) use of insulin: Secondary | ICD-10-CM

## 2023-08-24 DIAGNOSIS — E1121 Type 2 diabetes mellitus with diabetic nephropathy: Secondary | ICD-10-CM

## 2023-08-24 DIAGNOSIS — E782 Mixed hyperlipidemia: Secondary | ICD-10-CM

## 2023-08-24 LAB — POCT CBG (FASTING - GLUCOSE)-MANUAL ENTRY: Glucose Fasting, POC: 232 mg/dL — AB (ref 70–99)

## 2023-08-24 MED ORDER — TRAMADOL HCL 50 MG PO TABS: 50.0000 mg | ORAL_TABLET | Freq: Four times a day (QID) | ORAL | 0 refills | Status: DC | PRN

## 2023-08-24 MED ORDER — KERENDIA 20 MG PO TABS: 20.0000 mg | ORAL_TABLET | Freq: Every day | ORAL | 2 refills | Status: AC

## 2023-08-24 MED ORDER — EZETIMIBE 10 MG PO TABS: 10.0000 mg | ORAL_TABLET | Freq: Every day | ORAL | 0 refills | Status: DC

## 2023-08-24 MED ORDER — OLMESARTAN MEDOXOMIL 40 MG PO TABS: 40.0000 mg | ORAL_TABLET | Freq: Every day | ORAL | 0 refills | Status: DC

## 2023-08-24 MED ORDER — INSULIN PEN NEEDLE 31G X 5 MM MISC: 1.0000 | Freq: Every day | 4 refills | Status: AC

## 2023-08-24 MED ORDER — ATORVASTATIN CALCIUM 80 MG PO TABS: 80.0000 mg | ORAL_TABLET | Freq: Every day | ORAL | 0 refills | Status: DC

## 2023-08-24 MED ORDER — INSULIN DEGLUDEC 100 UNIT/ML ~~LOC~~ SOLN: 72.0000 [IU] | Freq: Every day | SUBCUTANEOUS | 2 refills | Status: DC

## 2023-08-24 MED ORDER — EMPAGLIFLOZIN 25 MG PO TABS: 25.0000 mg | ORAL_TABLET | Freq: Every day | ORAL | 0 refills | Status: DC

## 2023-08-24 NOTE — Progress Notes (Signed)
 Established Patient Office Visit  Subjective:  Patient ID: [name], male    DOB: 22-May-1975  Age: 48 y.o. MRN: [id number]  Chief Complaint  Patient presents with   Follow-up    Discuss lab results    No new complaints, here for lab review and medication refills. Labs reviewed and notable for uncontrolled diabetes, A1c not at target, lipids elevated with unremarkable cmp. Admits to poor compliance with meds as he moved apartments and misplaced his meds.    No other concerns at this time.   Past Medical History:  Diagnosis Date   Diabetes mellitus without complication (HCC)    Hypertension     No past surgical history on file.  Social History   Socioeconomic History   Marital status: Single    Spouse name: Not on file   Number of children: Not on file   Years of education: Not on file   Highest education level: Not on file  Occupational History   Not on file  Tobacco Use   Smoking status: Every Day   Smokeless tobacco: Never  Substance and Sexual Activity   Alcohol use: Yes    Alcohol/week: 0.0 standard drinks of alcohol   Drug use: No   Sexual activity: Not on file  Other Topics Concern   Not on file  Social History Narrative   Not on file   Social Drivers of Health   Financial Resource Strain: Not on file  Food Insecurity: No Food Insecurity (06/22/2019)   Received from [facility], [facility]   Hunger Vital Sign    Worried About Running Out of Food in the Last Year: Never true    Ran Out of Food in the Last Year: Never true  Transportation Needs: Not on file  Physical Activity: Not on file  Stress: Not on file  Social Connections: Not on file  Intimate Partner Violence: Not on file    No family history on file.  No Known Allergies  Outpatient Medications Prior to Visit  Medication Sig   aspirin 81 MG tablet Take 81 mg by mouth daily.   chlorthalidone  (HYGROTON ) 25 MG tablet Take 1 tablet (25 mg total) by mouth daily.    Semaglutide , 1 MG/DOSE, (OZEMPIC , 1 MG/DOSE,) 4 MG/3ML SOPN INJECT 1 MG INTO THE SKIN ONE TIME PER WEEK   traMADol  (ULTRAM ) 50 MG tablet Take 1 tablet (50 mg total) by mouth 3 (three) times daily for 23 days.   TRESIBA  FLEXTOUCH 100 UNIT/ML FlexTouch Pen    [DISCONTINUED] atorvastatin  (LIPITOR) 80 MG tablet Take 1 tablet by mouth daily.   [DISCONTINUED] FARXIGA 10 MG TABS tablet Take 10 mg by mouth every morning.   [DISCONTINUED] olmesartan  (BENICAR ) 40 MG tablet Take 1 tablet (40 mg total) by mouth daily.   [DISCONTINUED] pregabalin (LYRICA) 150 MG capsule Take 150 mg by mouth 2 (two) times daily.   [DISCONTINUED] traMADol  (ULTRAM ) 50 MG tablet Take 50 mg by mouth every 6 (six) hours as needed.   [DISCONTINUED] ezetimibe (ZETIA) 10 MG tablet Take 10 mg by mouth daily. (Patient not taking: Reported on 08/24/2023)   No facility-administered medications prior to visit.    Review of Systems  Constitutional: Negative.   HENT: Negative.    Eyes: Negative.   Respiratory: Negative.    Cardiovascular: Negative.   Gastrointestinal: Negative.   Genitourinary: Negative.   Skin: Negative.   Neurological: Negative.   Endo/Heme/Allergies: Negative.        Objective:  BP 122/84   Pulse (!) 109   Temp (!) 97.5 F (36.4 C) (Tympanic)   Ht 5\' 9"  (1.753 m)   Wt 239 lb 6.4 oz (108.6 kg)   SpO2 95%   BMI 35.35 kg/m   Vitals:   08/24/23 1045  BP: 122/84  Pulse: (!) 109  Temp: (!) 97.5 F (36.4 C)  Height: 5\' 9"  (1.753 m)  Weight: 239 lb 6.4 oz (108.6 kg)  SpO2: 95%  TempSrc: Tympanic  BMI (Calculated): 35.34    Physical Exam Vitals reviewed.  Constitutional:      Appearance: Normal appearance. He is obese.  HENT:     Head: Normocephalic.     Left Ear: There is no impacted cerumen.     Nose: Nose normal.     Mouth/Throat:     Mouth: Mucous membranes are moist.     Pharynx: No posterior oropharyngeal erythema.  Eyes:     Extraocular Movements: Extraocular movements intact.      Pupils: Pupils are equal, round, and reactive to light.  Cardiovascular:     Rate and Rhythm: Regular rhythm.     Chest Wall: PMI is not displaced.     Pulses: Normal pulses.     Heart sounds: Normal heart sounds. No murmur heard. Pulmonary:     Effort: Pulmonary effort is normal.     Breath sounds: Normal air entry. No rhonchi or rales.  Abdominal:     General: Abdomen is flat. Bowel sounds are normal. There is no distension.     Palpations: Abdomen is soft. There is no hepatomegaly, splenomegaly or mass.     Tenderness: There is no abdominal tenderness.  Musculoskeletal:        General: Normal range of motion.     Cervical back: Normal range of motion and neck supple.     Right lower leg: No edema.     Left lower leg: No edema.  Skin:    General: Skin is warm and dry.     Comments: Left ankle wound healed  Neurological:     General: No focal deficit present.     Mental Status: He is alert and oriented to person, place, and time.     Cranial Nerves: No cranial nerve deficit.     Motor: No weakness.  Psychiatric:        Mood and Affect: Mood normal.        Behavior: Behavior normal.      Results for orders placed or performed in visit on 08/24/23  POCT CBG (Fasting - Glucose)  Result Value Ref Range   Glucose Fasting, POC 232 (A) 70 - 99 mg/dL    Recent Results (from the past 2160 hours)  POCT CBG (Fasting - Glucose)     Status: Abnormal   Collection Time: 08/24/23 10:53 AM  Result Value Ref Range   Glucose Fasting, POC 232 (A) 70 - 99 mg/dL      Assessment & Plan:  As per problem list. Patient instructed to comply with medications as prescribed. Farxiga replaced with Jardiance due to insurance restrictions.  Problem List Items Addressed This Visit       Cardiovascular and Mediastinum   Essential (primary) hypertension   Relevant Medications   atorvastatin  (LIPITOR) 80 MG tablet   ezetimibe (ZETIA) 10 MG tablet   olmesartan  (BENICAR ) 40 MG tablet      Endocrine   Uncontrolled type 2 diabetes mellitus with hyperglycemia, with long-term current use of insulin  (HCC) - Primary  Relevant Medications   TRESIBA  FLEXTOUCH 100 UNIT/ML FlexTouch Pen   atorvastatin  (LIPITOR) 80 MG tablet   olmesartan  (BENICAR ) 40 MG tablet   empagliflozin (JARDIANCE) 25 MG TABS tablet   Insulin  Degludec (TRESIBA ) 100 UNIT/ML SOLN   Insulin  Pen Needle 31G X 5 MM MISC   Other Relevant Orders   POCT CBG (Fasting - Glucose) (Completed)     Other   Other chronic pain   Relevant Medications   traMADol  (ULTRAM ) 50 MG tablet   Other Visit Diagnoses       Type 2 diabetes mellitus without complication, with long-term current use of insulin  (HCC)       Relevant Medications   TRESIBA  FLEXTOUCH 100 UNIT/ML FlexTouch Pen   atorvastatin  (LIPITOR) 80 MG tablet   olmesartan  (BENICAR ) 40 MG tablet   empagliflozin (JARDIANCE) 25 MG TABS tablet   Insulin  Degludec (TRESIBA ) 100 UNIT/ML SOLN   Other Relevant Orders   Fructosamine     Diabetic nephropathy associated with type 2 diabetes mellitus (HCC)       Relevant Medications   TRESIBA  FLEXTOUCH 100 UNIT/ML FlexTouch Pen   atorvastatin  (LIPITOR) 80 MG tablet   olmesartan  (BENICAR ) 40 MG tablet   empagliflozin (JARDIANCE) 25 MG TABS tablet   Insulin  Degludec (TRESIBA ) 100 UNIT/ML SOLN   Finerenone  (KERENDIA ) 20 MG TABS     Mixed hyperlipidemia       Relevant Medications   atorvastatin  (LIPITOR) 80 MG tablet   ezetimibe (ZETIA) 10 MG tablet   olmesartan  (BENICAR ) 40 MG tablet       Return in about 6 weeks (around 10/05/2023) for fu with labs prior.   Total time spent: 20 minutes  Arzella Bitters, MD  08/24/2023   This document may have been prepared by [facility] Voice Recognition software and as such may include unintentional dictation errors.

## 2023-08-28 ENCOUNTER — Other Ambulatory Visit: Payer: Self-pay | Admitting: Internal Medicine

## 2023-08-28 DIAGNOSIS — E1165 Type 2 diabetes mellitus with hyperglycemia: Secondary | ICD-10-CM

## 2023-10-05 ENCOUNTER — Ambulatory Visit: Admitting: Internal Medicine

## 2023-10-10 ENCOUNTER — Other Ambulatory Visit: Payer: Self-pay | Admitting: Internal Medicine

## 2023-10-10 DIAGNOSIS — G8929 Other chronic pain: Secondary | ICD-10-CM

## 2023-11-16 ENCOUNTER — Other Ambulatory Visit: Payer: Self-pay | Admitting: Internal Medicine

## 2023-11-16 DIAGNOSIS — E1165 Type 2 diabetes mellitus with hyperglycemia: Secondary | ICD-10-CM

## 2023-11-17 DIAGNOSIS — E119 Type 2 diabetes mellitus without complications: Secondary | ICD-10-CM | POA: Diagnosis not present

## 2023-11-17 DIAGNOSIS — Z794 Long term (current) use of insulin: Secondary | ICD-10-CM | POA: Diagnosis not present

## 2023-11-19 ENCOUNTER — Other Ambulatory Visit: Payer: Self-pay | Admitting: Internal Medicine

## 2023-11-19 DIAGNOSIS — E782 Mixed hyperlipidemia: Secondary | ICD-10-CM

## 2023-11-19 DIAGNOSIS — E1165 Type 2 diabetes mellitus with hyperglycemia: Secondary | ICD-10-CM

## 2023-11-21 ENCOUNTER — Other Ambulatory Visit: Payer: Self-pay | Admitting: Internal Medicine

## 2023-11-21 DIAGNOSIS — G8929 Other chronic pain: Secondary | ICD-10-CM

## 2023-11-22 ENCOUNTER — Encounter: Payer: Self-pay | Admitting: Internal Medicine

## 2023-11-22 ENCOUNTER — Ambulatory Visit (INDEPENDENT_AMBULATORY_CARE_PROVIDER_SITE_OTHER): Admitting: Internal Medicine

## 2023-11-22 VITALS — BP 131/87 | HR 91 | Temp 97.8°F | Ht 69.0 in | Wt 232.4 lb

## 2023-11-22 DIAGNOSIS — I10 Essential (primary) hypertension: Secondary | ICD-10-CM | POA: Diagnosis not present

## 2023-11-22 DIAGNOSIS — E1165 Type 2 diabetes mellitus with hyperglycemia: Secondary | ICD-10-CM

## 2023-11-22 DIAGNOSIS — Z794 Long term (current) use of insulin: Secondary | ICD-10-CM | POA: Diagnosis not present

## 2023-11-22 DIAGNOSIS — E782 Mixed hyperlipidemia: Secondary | ICD-10-CM

## 2023-11-22 LAB — POCT CBG (FASTING - GLUCOSE)-MANUAL ENTRY: Glucose Fasting, POC: 239 mg/dL — AB (ref 70–99)

## 2023-11-22 MED ORDER — INSULIN DEGLUDEC 100 UNIT/ML ~~LOC~~ SOLN: 84.0000 [IU] | Freq: Every day | SUBCUTANEOUS | 2 refills | Status: AC

## 2023-11-22 NOTE — Progress Notes (Signed)
 Established Patient Office Visit  Subjective:  Patient ID: [name], male    DOB: 30-Apr-1975  Age: 48 y.o. MRN: [id number]  Chief Complaint  Patient presents with   Follow-up    6 week follow up - lab results    No new complaints, here for lab review and medication refills. Labs reviewed and notable for uncontrolled diabetes, fructosamine not at target. Denies any hypoglycemic episodes. Admits to poor compliance with his medications and diet.       No other concerns at this time.   Past Medical History:  Diagnosis Date   Diabetes mellitus without complication (HCC)    Hypertension     No past surgical history on file.  Social History   Socioeconomic History   Marital status: Single    Spouse name: Not on file   Number of children: Not on file   Years of education: Not on file   Highest education level: Not on file  Occupational History   Not on file  Tobacco Use   Smoking status: Every Day   Smokeless tobacco: Never  Substance and Sexual Activity   Alcohol use: Yes    Alcohol/week: 0.0 standard drinks of alcohol   Drug use: No   Sexual activity: Not on file  Other Topics Concern   Not on file  Social History Narrative   Not on file   Social Drivers of Health   Financial Resource Strain: Not on file  Food Insecurity: No Food Insecurity (06/22/2019)   Received from [facility]   Hunger Vital Sign    Within the past 12 months, you worried that your food would run out before you got the money to buy more.: Never true    Within the past 12 months, the food you bought just didn't last and you didn't have money to get more.: Never true  Transportation Needs: Not on file  Physical Activity: Not on file  Stress: Not on file  Social Connections: Not on file  Intimate Partner Violence: Not on file    No family history on file.  No Known Allergies  Outpatient Medications Prior to Visit  Medication Sig   aspirin 81 MG tablet Take 81 mg by mouth  daily.   atorvastatin  (LIPITOR) 80 MG tablet TAKE 1 TABLET BY MOUTH EVERY DAY   chlorthalidone  (HYGROTON ) 25 MG tablet Take 1 tablet (25 mg total) by mouth daily.   ezetimibe  (ZETIA ) 10 MG tablet TAKE 1 TABLET BY MOUTH EVERY DAY   Finerenone  (KERENDIA ) 20 MG TABS Take 1 tablet (20 mg total) by mouth daily.   Insulin  Pen Needle 31G X 5 MM MISC 1 Needle by Does not apply route daily in the afternoon.   JARDIANCE  25 MG TABS tablet TAKE 1 TABLET (25 MG TOTAL) BY MOUTH DAILY.   olmesartan  (BENICAR ) 40 MG tablet Take 1 tablet (40 mg total) by mouth daily.   Semaglutide , 1 MG/DOSE, (OZEMPIC , 1 MG/DOSE,) 4 MG/3ML SOPN INJECT 1 MG INTO THE SKIN ONE TIME PER WEEK   traMADol  (ULTRAM ) 50 MG tablet TAKE 1 TABLET BY MOUTH EVERY 6 HOURS AS NEEDED   [DISCONTINUED] Insulin  Degludec (TRESIBA ) 100 UNIT/ML SOLN Inject 72 Units into the skin daily in the afternoon.   [DISCONTINUED] TRESIBA  FLEXTOUCH 100 UNIT/ML FlexTouch Pen    No facility-administered medications prior to visit.    Review of Systems  Constitutional:  Positive for weight loss (7 lbs).  HENT: Negative.    Eyes: Negative.   Respiratory:  Negative.    Cardiovascular: Negative.   Gastrointestinal: Negative.   Genitourinary: Negative.   Skin: Negative.   Neurological: Negative.   Endo/Heme/Allergies: Negative.        Objective:   BP 131/87   Pulse 91   Temp 97.8 F (36.6 C)   Ht 5' 9 (1.753 m)   Wt 232 lb 6.4 oz (105.4 kg)   SpO2 97%   BMI 34.32 kg/m   Vitals:   11/22/23 0956  BP: 131/87  Pulse: 91  Temp: 97.8 F (36.6 C)  Height: 5' 9 (1.753 m)  Weight: 232 lb 6.4 oz (105.4 kg)  SpO2: 97%  BMI (Calculated): 34.3    Physical Exam Vitals reviewed.  Constitutional:      Appearance: Normal appearance. He is obese.  HENT:     Head: Normocephalic.     Left Ear: There is no impacted cerumen.     Nose: Nose normal.     Mouth/Throat:     Mouth: Mucous membranes are moist.     Pharynx: No posterior oropharyngeal  erythema.  Eyes:     Extraocular Movements: Extraocular movements intact.     Pupils: Pupils are equal, round, and reactive to light.  Cardiovascular:     Rate and Rhythm: Regular rhythm.     Chest Wall: PMI is not displaced.     Pulses: Normal pulses.     Heart sounds: Normal heart sounds. No murmur heard. Pulmonary:     Effort: Pulmonary effort is normal.     Breath sounds: Normal air entry. No rhonchi or rales.  Abdominal:     General: Abdomen is flat. Bowel sounds are normal. There is no distension.     Palpations: Abdomen is soft. There is no hepatomegaly, splenomegaly or mass.     Tenderness: There is no abdominal tenderness.  Musculoskeletal:        General: Normal range of motion.     Cervical back: Normal range of motion and neck supple.     Right lower leg: No edema.     Left lower leg: No edema.  Skin:    General: Skin is warm and dry.     Comments: Left ankle wound healed  Neurological:     General: No focal deficit present.     Mental Status: He is alert and oriented to person, place, and time.     Cranial Nerves: No cranial nerve deficit.     Motor: No weakness.  Psychiatric:        Mood and Affect: Mood normal.        Behavior: Behavior normal.      Results for orders placed or performed in visit on 11/22/23  POCT CBG (Fasting - Glucose)  Result Value Ref Range   Glucose Fasting, POC 239 (A) 70 - 99 mg/dL    Recent Results (from the past 2160 hours)  POCT CBG (Fasting - Glucose)     Status: Abnormal   Collection Time: 08/24/23 10:53 AM  Result Value Ref Range   Glucose Fasting, POC 232 (A) 70 - 99 mg/dL  POCT CBG (Fasting - Glucose)     Status: Abnormal   Collection Time: 11/22/23 10:04 AM  Result Value Ref Range   Glucose Fasting, POC 239 (A) 70 - 99 mg/dL      Assessment & Plan:  [name] was seen today for follow-up.  Uncontrolled type 2 diabetes mellitus with hyperglycemia, with long-term current use of insulin  (HCC) -     POCT  CBG (Fasting -  Glucose) -     Insulin  Degludec; Inject 84 Units into the skin daily in the afternoon.  Dispense: 30 mL; Refill: 2 -     Hemoglobin A1c; Future  Mixed hyperlipidemia -     Lipid panel  Primary hypertension -     Comprehensive metabolic panel with GFR  Increase insulin . Stricter low calorie diet, low cholesterol and low fat diet and exercise as much as possible. Patient instructed to comply with medications as prescribed   Problem List Items Addressed This Visit       Endocrine   Uncontrolled type 2 diabetes mellitus with hyperglycemia, with long-term current use of insulin  (HCC) - Primary   Relevant Medications   Insulin  Degludec (TRESIBA ) 100 UNIT/ML SOLN   Other Relevant Orders   POCT CBG (Fasting - Glucose) (Completed)   Hemoglobin A1c   Other Visit Diagnoses       Mixed hyperlipidemia       Relevant Orders   Lipid panel     Primary hypertension       Relevant Orders   Comprehensive metabolic panel with GFR       Return in about 6 weeks (around 01/03/2024) for fu with labs prior.   Total time spent: 30 minutes  Sherrill Cinderella Perry, MD  11/22/2023   This document may have been prepared by [facility] Voice Recognition software and as such may include unintentional dictation errors.

## 2023-12-26 ENCOUNTER — Other Ambulatory Visit: Payer: Self-pay | Admitting: Internal Medicine

## 2023-12-26 DIAGNOSIS — G8929 Other chronic pain: Secondary | ICD-10-CM

## 2024-01-02 ENCOUNTER — Other Ambulatory Visit: Payer: Self-pay | Admitting: Internal Medicine

## 2024-01-02 DIAGNOSIS — E1165 Type 2 diabetes mellitus with hyperglycemia: Secondary | ICD-10-CM

## 2024-01-03 ENCOUNTER — Ambulatory Visit: Admitting: Internal Medicine

## 2024-01-05 DIAGNOSIS — L97522 Non-pressure chronic ulcer of other part of left foot with fat layer exposed: Secondary | ICD-10-CM | POA: Diagnosis not present

## 2024-01-05 DIAGNOSIS — F17200 Nicotine dependence, unspecified, uncomplicated: Secondary | ICD-10-CM | POA: Diagnosis not present

## 2024-01-05 DIAGNOSIS — I10 Essential (primary) hypertension: Secondary | ICD-10-CM | POA: Diagnosis not present

## 2024-01-05 DIAGNOSIS — E1140 Type 2 diabetes mellitus with diabetic neuropathy, unspecified: Secondary | ICD-10-CM | POA: Diagnosis not present

## 2024-01-05 DIAGNOSIS — L84 Corns and callosities: Secondary | ICD-10-CM | POA: Diagnosis not present

## 2024-01-05 DIAGNOSIS — E1165 Type 2 diabetes mellitus with hyperglycemia: Secondary | ICD-10-CM | POA: Diagnosis not present

## 2024-01-05 DIAGNOSIS — M21372 Foot drop, left foot: Secondary | ICD-10-CM | POA: Diagnosis not present

## 2024-01-05 DIAGNOSIS — E11621 Type 2 diabetes mellitus with foot ulcer: Secondary | ICD-10-CM | POA: Diagnosis not present

## 2024-02-02 ENCOUNTER — Other Ambulatory Visit: Payer: Self-pay | Admitting: Internal Medicine

## 2024-02-02 DIAGNOSIS — E1141 Type 2 diabetes mellitus with diabetic mononeuropathy: Secondary | ICD-10-CM | POA: Diagnosis not present

## 2024-02-02 DIAGNOSIS — Z720 Tobacco use: Secondary | ICD-10-CM | POA: Diagnosis not present

## 2024-02-02 DIAGNOSIS — F17290 Nicotine dependence, other tobacco product, uncomplicated: Secondary | ICD-10-CM | POA: Diagnosis not present

## 2024-02-02 DIAGNOSIS — G8929 Other chronic pain: Secondary | ICD-10-CM

## 2024-02-02 DIAGNOSIS — E1140 Type 2 diabetes mellitus with diabetic neuropathy, unspecified: Secondary | ICD-10-CM | POA: Diagnosis not present

## 2024-02-02 DIAGNOSIS — I10 Essential (primary) hypertension: Secondary | ICD-10-CM | POA: Diagnosis not present

## 2024-02-02 DIAGNOSIS — M79675 Pain in left toe(s): Secondary | ICD-10-CM | POA: Diagnosis not present

## 2024-02-02 DIAGNOSIS — E1151 Type 2 diabetes mellitus with diabetic peripheral angiopathy without gangrene: Secondary | ICD-10-CM | POA: Diagnosis not present

## 2024-02-02 DIAGNOSIS — E11621 Type 2 diabetes mellitus with foot ulcer: Secondary | ICD-10-CM | POA: Diagnosis not present

## 2024-02-02 DIAGNOSIS — M21372 Foot drop, left foot: Secondary | ICD-10-CM | POA: Diagnosis not present

## 2024-02-02 DIAGNOSIS — B351 Tinea unguium: Secondary | ICD-10-CM | POA: Diagnosis not present

## 2024-02-02 DIAGNOSIS — E1165 Type 2 diabetes mellitus with hyperglycemia: Secondary | ICD-10-CM | POA: Diagnosis not present

## 2024-02-02 DIAGNOSIS — L97522 Non-pressure chronic ulcer of other part of left foot with fat layer exposed: Secondary | ICD-10-CM | POA: Diagnosis not present

## 2024-02-06 ENCOUNTER — Ambulatory Visit: Admitting: Internal Medicine

## 2024-02-13 ENCOUNTER — Other Ambulatory Visit: Payer: Self-pay | Admitting: Internal Medicine

## 2024-02-13 DIAGNOSIS — E782 Mixed hyperlipidemia: Secondary | ICD-10-CM

## 2024-02-17 ENCOUNTER — Ambulatory Visit: Admitting: Internal Medicine

## 2024-02-17 DIAGNOSIS — E11621 Type 2 diabetes mellitus with foot ulcer: Secondary | ICD-10-CM | POA: Diagnosis not present

## 2024-02-17 DIAGNOSIS — E1151 Type 2 diabetes mellitus with diabetic peripheral angiopathy without gangrene: Secondary | ICD-10-CM | POA: Diagnosis not present

## 2024-02-17 DIAGNOSIS — E1141 Type 2 diabetes mellitus with diabetic mononeuropathy: Secondary | ICD-10-CM | POA: Diagnosis not present

## 2024-02-17 DIAGNOSIS — Z794 Long term (current) use of insulin: Secondary | ICD-10-CM | POA: Diagnosis not present

## 2024-02-17 DIAGNOSIS — F17290 Nicotine dependence, other tobacco product, uncomplicated: Secondary | ICD-10-CM | POA: Diagnosis not present

## 2024-02-17 DIAGNOSIS — L97529 Non-pressure chronic ulcer of other part of left foot with unspecified severity: Secondary | ICD-10-CM | POA: Diagnosis not present

## 2024-02-17 DIAGNOSIS — I70245 Atherosclerosis of native arteries of left leg with ulceration of other part of foot: Secondary | ICD-10-CM | POA: Diagnosis not present

## 2024-02-17 DIAGNOSIS — I10 Essential (primary) hypertension: Secondary | ICD-10-CM | POA: Diagnosis not present

## 2024-02-17 DIAGNOSIS — S91302D Unspecified open wound, left foot, subsequent encounter: Secondary | ICD-10-CM | POA: Diagnosis not present

## 2024-02-17 DIAGNOSIS — Z7984 Long term (current) use of oral hypoglycemic drugs: Secondary | ICD-10-CM | POA: Diagnosis not present

## 2024-02-20 ENCOUNTER — Other Ambulatory Visit: Payer: Self-pay | Admitting: Internal Medicine

## 2024-02-20 DIAGNOSIS — G8929 Other chronic pain: Secondary | ICD-10-CM

## 2024-02-28 ENCOUNTER — Ambulatory Visit: Admitting: Internal Medicine

## 2024-02-28 VITALS — BP 168/92 | HR 98 | Temp 98.8°F | Ht 69.0 in

## 2024-02-28 DIAGNOSIS — G8929 Other chronic pain: Secondary | ICD-10-CM | POA: Diagnosis not present

## 2024-02-28 DIAGNOSIS — Z23 Encounter for immunization: Secondary | ICD-10-CM | POA: Diagnosis not present

## 2024-02-28 DIAGNOSIS — I10 Essential (primary) hypertension: Secondary | ICD-10-CM | POA: Diagnosis not present

## 2024-02-28 DIAGNOSIS — E1165 Type 2 diabetes mellitus with hyperglycemia: Secondary | ICD-10-CM | POA: Diagnosis not present

## 2024-02-28 DIAGNOSIS — Z794 Long term (current) use of insulin: Secondary | ICD-10-CM | POA: Diagnosis not present

## 2024-02-28 DIAGNOSIS — E782 Mixed hyperlipidemia: Secondary | ICD-10-CM | POA: Diagnosis not present

## 2024-02-28 DIAGNOSIS — E119 Type 2 diabetes mellitus without complications: Secondary | ICD-10-CM

## 2024-02-28 LAB — POCT CBG (FASTING - GLUCOSE)-MANUAL ENTRY: Glucose Fasting, POC: 139 mg/dL — AB (ref 70–99)

## 2024-02-28 LAB — POC CREATINE & ALBUMIN,URINE
Albumin/Creatinine Ratio, Urine, POC: <30
Creatinine, POC: 50 mg/dL
Microalbumin Ur, POC: 10 mg/L

## 2024-02-28 MED ORDER — EMPAGLIFLOZIN 25 MG PO TABS: 25.0000 mg | ORAL_TABLET | Freq: Every day | ORAL | 0 refills | Status: AC

## 2024-02-28 MED ORDER — TRAMADOL HCL 50 MG PO TABS: 50.0000 mg | ORAL_TABLET | Freq: Four times a day (QID) | ORAL | 0 refills | Status: DC | PRN

## 2024-02-28 MED ORDER — CHLORTHALIDONE 25 MG PO TABS: 25.0000 mg | ORAL_TABLET | Freq: Every day | ORAL | 0 refills | Status: AC

## 2024-02-28 MED ORDER — OLMESARTAN MEDOXOMIL 40 MG PO TABS: 40.0000 mg | ORAL_TABLET | Freq: Every day | ORAL | 0 refills | Status: AC

## 2024-02-28 NOTE — Progress Notes (Signed)
 Established Patient Office Visit  Subjective:  Patient ID: [name], male    DOB: Apr 18, 1975  Age: 48 y.o. MRN: [id number]  Chief Complaint  Patient presents with   Follow-up    6 weeks lab results    No new complaints, here for lab review and medication refills. Failed to have previsit labs done. BP high but admits to poor compliance with his meds.      No other concerns at this time.   Past Medical History:  Diagnosis Date   Diabetes mellitus without complication (HCC)    Hypertension     No past surgical history on file.  Social History   Socioeconomic History   Marital status: Single    Spouse name: Not on file   Number of children: Not on file   Years of education: Not on file   Highest education level: Not on file  Occupational History   Not on file  Tobacco Use   Smoking status: Every Day   Smokeless tobacco: Never  Substance and Sexual Activity   Alcohol use: Yes    Alcohol/week: 0.0 standard drinks of alcohol   Drug use: No   Sexual activity: Not on file  Other Topics Concern   Not on file  Social History Narrative   Not on file   Social Drivers of Health   Financial Resource Strain: Not on file  Food Insecurity: No Food Insecurity (06/22/2019)   Received from [facility]   Hunger Vital Sign    Within the past 12 months, you worried that your food would run out before you got the money to buy more.: Never true    Within the past 12 months, the food you bought just didn't last and you didn't have money to get more.: Never true  Transportation Needs: Not on file  Physical Activity: Not on file  Stress: Not on file  Social Connections: Not on file  Intimate Partner Violence: Not on file    No family history on file.  No Known Allergies  Outpatient Medications Prior to Visit  Medication Sig   aspirin 81 MG tablet Take 81 mg by mouth daily.   atorvastatin  (LIPITOR) 80 MG tablet TAKE 1 TABLET BY MOUTH EVERY DAY   ezetimibe   (ZETIA ) 10 MG tablet TAKE 1 TABLET BY MOUTH EVERY DAY   Insulin  Pen Needle 31G X 5 MM MISC 1 Needle by Does not apply route daily in the afternoon.   [DISCONTINUED] chlorthalidone  (HYGROTON ) 25 MG tablet Take 1 tablet (25 mg total) by mouth daily.   [DISCONTINUED] JARDIANCE  25 MG TABS tablet TAKE 1 TABLET (25 MG TOTAL) BY MOUTH DAILY.   [DISCONTINUED] olmesartan  (BENICAR ) 40 MG tablet Take 1 tablet (40 mg total) by mouth daily.   [DISCONTINUED] traMADol  (ULTRAM ) 50 MG tablet TAKE 1 TABLET BY MOUTH EVERY 6 HOURS AS NEEDED   No facility-administered medications prior to visit.    Review of Systems  Constitutional:  Negative for weight loss.  HENT: Negative.    Eyes: Negative.   Respiratory: Negative.    Cardiovascular: Negative.   Gastrointestinal: Negative.   Genitourinary: Negative.   Skin: Negative.   Neurological: Negative.   Endo/Heme/Allergies: Negative.        Objective:   BP (!) 168/92   Pulse 98   Temp 98.8 F (37.1 C) (Tympanic)   Ht 5' 9 (1.753 m)   SpO2 97%   BMI 34.32 kg/m   Vitals:   02/28/24 1426  BP: ROLLEN)  168/92  Pulse: 98  Temp: 98.8 F (37.1 C)  Height: 5' 9 (1.753 m)  SpO2: 97%  TempSrc: Tympanic    Physical Exam Vitals reviewed.  Constitutional:      Appearance: Normal appearance. He is obese.  HENT:     Head: Normocephalic.     Left Ear: There is no impacted cerumen.     Nose: Nose normal.     Mouth/Throat:     Mouth: Mucous membranes are moist.     Pharynx: No posterior oropharyngeal erythema.  Eyes:     Extraocular Movements: Extraocular movements intact.     Pupils: Pupils are equal, round, and reactive to light.  Cardiovascular:     Rate and Rhythm: Regular rhythm.     Chest Wall: PMI is not displaced.     Pulses: Normal pulses.     Heart sounds: Normal heart sounds. No murmur heard. Pulmonary:     Effort: Pulmonary effort is normal.     Breath sounds: Normal air entry. No rhonchi or rales.  Abdominal:     General: Abdomen  is flat. Bowel sounds are normal. There is no distension.     Palpations: Abdomen is soft. There is no hepatomegaly, splenomegaly or mass.     Tenderness: There is no abdominal tenderness.  Musculoskeletal:        General: Normal range of motion.     Cervical back: Normal range of motion and neck supple.     Right lower leg: No edema.     Left lower leg: No edema.  Skin:    General: Skin is warm and dry.     Comments: Left ankle wound healed  Neurological:     General: No focal deficit present.     Mental Status: He is alert and oriented to person, place, and time.     Cranial Nerves: No cranial nerve deficit.     Motor: No weakness.  Psychiatric:        Mood and Affect: Mood normal.        Behavior: Behavior normal.      Results for orders placed or performed in visit on 02/28/24  POCT CBG (Fasting - Glucose)  Result Value Ref Range   Glucose Fasting, POC 139 (A) 70 - 99 mg/dL  POC CREATINE & ALBUMIN,URINE  Result Value Ref Range   Microalbumin Ur, POC 10 mg/L   Creatinine, POC 50 mg/dL   Albumin/Creatinine Ratio, Urine, POC <30     Recent Results (from the past 2160 hours)  POCT CBG (Fasting - Glucose)     Status: Abnormal   Collection Time: 02/28/24  2:30 PM  Result Value Ref Range   Glucose Fasting, POC 139 (A) 70 - 99 mg/dL  POC CREATINE & ALBUMIN,URINE     Status: Normal   Collection Time: 02/28/24  2:57 PM  Result Value Ref Range   Microalbumin Ur, POC 10 mg/L   Creatinine, POC 50 mg/dL   Albumin/Creatinine Ratio, Urine, POC <30       Assessment & Plan:  Drae was seen today for follow-up.  Type 2 diabetes mellitus without complication, with long-term current use of insulin  (HCC) -     POCT CBG (Fasting - Glucose) -     POC CREATINE & ALBUMIN,URINE  Other chronic pain -     traMADol  HCl; Take 1 tablet (50 mg total) by mouth every 6 (six) hours as needed for up to 15 days.  Dispense: 60 tablet; Refill: 0  Uncontrolled  type 2 diabetes mellitus with  hyperglycemia, with long-term current use of insulin  (HCC) -     Empagliflozin ; Take 1 tablet (25 mg total) by mouth daily.  Dispense: 90 tablet; Refill: 0  Essential (primary) hypertension -     Olmesartan  Medoxomil; Take 1 tablet (40 mg total) by mouth daily.  Dispense: 90 tablet; Refill: 0 -     Chlorthalidone ; Take 1 tablet (25 mg total) by mouth daily.  Dispense: 90 tablet; Refill: 0  Flu vaccine need -     Influenza, MDCK, trivalent, PF(Flucelvax egg-free)    Problem List Items Addressed This Visit       Cardiovascular and Mediastinum   Essential (primary) hypertension   Relevant Medications   olmesartan  (BENICAR ) 40 MG tablet   chlorthalidone  (HYGROTON ) 25 MG tablet     Endocrine   Uncontrolled type 2 diabetes mellitus with hyperglycemia, with long-term current use of insulin  (HCC)   Relevant Medications   empagliflozin  (JARDIANCE ) 25 MG TABS tablet   olmesartan  (BENICAR ) 40 MG tablet     Other   Other chronic pain   Relevant Medications   traMADol  (ULTRAM ) 50 MG tablet   Other Visit Diagnoses       Type 2 diabetes mellitus without complication, with long-term current use of insulin  (HCC)    -  Primary   Relevant Medications   empagliflozin  (JARDIANCE ) 25 MG TABS tablet   olmesartan  (BENICAR ) 40 MG tablet   Other Relevant Orders   POCT CBG (Fasting - Glucose) (Completed)   POC CREATINE & ALBUMIN,URINE (Completed)     Flu vaccine need       Relevant Orders   Influenza, MDCK, trivalent, PF(Flucelvax egg-free) (Completed)       Return in about 2 weeks (around 03/13/2024) for lab results.   Total time spent: 20 minutes. This time includes review of previous notes and results and patient face to face interaction during today'Beverely Suen visit.    Sherrill Cinderella Perry, MD  02/28/2024   This document may have been prepared by [facility] Voice Recognition software and as such may include unintentional dictation errors.

## 2024-03-16 ENCOUNTER — Ambulatory Visit: Admitting: Internal Medicine

## 2024-03-23 ENCOUNTER — Telehealth: Payer: Self-pay

## 2024-03-23 NOTE — Progress Notes (Signed)
" ° °  03/23/2024  Patient ID: [name], male   DOB: 1975/12/08, 48 y.o.   MRN: [id number]  Pharmacy Quality Measure Review  This patient is appearing on a report for being at risk of failing the Controlling Blood Pressure measure this calendar year.   Last documented BP 168/92 on 02/28/24  Attempted to contact patient. Left HIPAA compliant message for patient to return my call at their convenience.   Jon VEAR Lindau, PharmD Clinical Pharmacist [phone]    "

## 2024-03-28 ENCOUNTER — Other Ambulatory Visit: Payer: Self-pay | Admitting: Internal Medicine

## 2024-03-28 DIAGNOSIS — G8929 Other chronic pain: Secondary | ICD-10-CM

## 2024-03-28 NOTE — Progress Notes (Signed)
 WHITTEN ANDREONI                                          MRN: [id number]   03/28/2024   The [facility] Quality Team Specialist reviewed this patient medical record for the purposes of chart review for care gap closure. The following were reviewed: chart review for care gap closure-controlling blood pressure.    [facility] Quality Team

## 2024-04-06 ENCOUNTER — Ambulatory Visit: Admitting: Internal Medicine

## 2024-05-08 ENCOUNTER — Ambulatory Visit: Admitting: Internal Medicine
